# Patient Record
Sex: Female | Born: 1985 | Hispanic: Yes | Marital: Single | Smoking: Never smoker
Health system: Southern US, Community
[De-identification: ages and names within clinical notes are randomized; demographics above are authoritative.]

## PROBLEM LIST (undated history)

## (undated) ENCOUNTER — Inpatient Hospital Stay: Payer: Self-pay

## (undated) DIAGNOSIS — F32A Depression, unspecified: Secondary | ICD-10-CM

## (undated) DIAGNOSIS — Z789 Other specified health status: Secondary | ICD-10-CM

---

## 2008-10-04 ENCOUNTER — Emergency Department: Payer: Self-pay | Admitting: Emergency Medicine

## 2009-03-05 ENCOUNTER — Observation Stay: Payer: Self-pay | Admitting: Obstetrics and Gynecology

## 2009-04-16 ENCOUNTER — Inpatient Hospital Stay: Payer: Self-pay | Admitting: Obstetrics and Gynecology

## 2011-05-30 ENCOUNTER — Ambulatory Visit: Payer: Self-pay | Admitting: Advanced Practice Midwife

## 2011-08-02 ENCOUNTER — Emergency Department: Payer: Self-pay | Admitting: Internal Medicine

## 2011-10-23 ENCOUNTER — Inpatient Hospital Stay: Payer: Self-pay | Admitting: Obstetrics and Gynecology

## 2012-08-01 ENCOUNTER — Emergency Department: Payer: Self-pay | Admitting: Emergency Medicine

## 2012-08-01 LAB — URINALYSIS, COMPLETE
Bacteria: NONE SEEN
Bilirubin,UR: NEGATIVE
Glucose,UR: 50 mg/dL (ref 0–75)
Ketone: NEGATIVE
Leukocyte Esterase: NEGATIVE
Protein: 100
RBC,UR: 31829 /HPF (ref 0–5)
Specific Gravity: 1.017 (ref 1.003–1.030)
WBC UR: 5 /HPF (ref 0–5)

## 2012-08-01 LAB — BASIC METABOLIC PANEL
Anion Gap: 7 (ref 7–16)
Chloride: 104 mmol/L (ref 98–107)
Co2: 26 mmol/L (ref 21–32)
Creatinine: 0.64 mg/dL (ref 0.60–1.30)
EGFR (Non-African Amer.): >60
Glucose: 73 mg/dL (ref 65–99)
Potassium: 3.9 mmol/L (ref 3.5–5.1)
Sodium: 137 mmol/L (ref 136–145)

## 2012-08-01 LAB — HCG, QUANTITATIVE, PREGNANCY: Beta Hcg, Quant.: 3323 m[IU]/mL — ABNORMAL HIGH

## 2012-08-01 LAB — CBC
HGB: 13.5 g/dL (ref 12.0–16.0)
Platelet: 370 10*3/uL (ref 150–440)
RBC: 4.73 10*6/uL (ref 3.80–5.20)
RDW: 14.8 % — ABNORMAL HIGH (ref 11.5–14.5)
WBC: 10.8 10*3/uL (ref 3.6–11.0)

## 2012-11-16 ENCOUNTER — Ambulatory Visit: Payer: Self-pay | Admitting: Family Medicine

## 2013-06-08 ENCOUNTER — Observation Stay: Payer: Self-pay

## 2013-07-02 ENCOUNTER — Inpatient Hospital Stay: Payer: Self-pay

## 2013-07-02 LAB — CBC WITH DIFFERENTIAL/PLATELET
Basophil #: 0.1 10*3/uL (ref 0.0–0.1)
Basophil %: 0.7 %
Eosinophil #: 0.2 10*3/uL (ref 0.0–0.7)
Eosinophil %: 2.7 %
HCT: 33 % — ABNORMAL LOW (ref 35.0–47.0)
Lymphocyte #: 2.7 10*3/uL (ref 1.0–3.6)
Lymphocyte %: 31.7 %
MCH: 24.6 pg — ABNORMAL LOW (ref 26.0–34.0)
MCHC: 33 g/dL (ref 32.0–36.0)
MCV: 75 fL — ABNORMAL LOW (ref 80–100)
Monocyte #: 0.8 x10 3/mm (ref 0.2–0.9)
Neutrophil #: 4.7 10*3/uL (ref 1.4–6.5)
Platelet: 272 10*3/uL (ref 150–440)
RBC: 4.43 10*6/uL (ref 3.80–5.20)
RDW: 17 % — ABNORMAL HIGH (ref 11.5–14.5)

## 2013-07-03 LAB — HEMATOCRIT: HCT: 35.6 % (ref 35.0–47.0)

## 2014-11-24 NOTE — L&D Delivery Note (Signed)
Delivery Note At 10:40 PM a viable viable was delivered via Vaginal, Spontaneous Delivery (Presentation: Right Occiput Anterior).  APGAR: 8, 9; weight 7 lb 3.7 oz (3280 g).   Placenta status: Intact, Spontaneous.  Cord: 3 vessels with the following complications: None.   Anesthesia: None  Episiotomy: None Lacerations: None Suture Repair: n/a Est. Blood Loss (mL): 300  Mom to postpartum.  Baby to Couplet care / Skin to Skin.  Called to see patient.  Mom pushed to delivery viable female infant.  The head followed by shoulders, which delivered without difficulty, and the rest of the body.  Double nuchal cord noted and delivered through, reduced after delivery of baby.  Baby to mom's chest.  Cord clamped and cut after > 1 min delay.  Cord blood obtained.  Placenta delivered spontaneously, intact, with a 3-vessel cord.  Labia, perineum, vagina, and cervix inspected and only mild abrasions noted.  All counts correct.  Hemostasis obtained with IV pitocin and fundal massage. EBL 300 mL.    Conard NovakJackson, Yassine Brunsman D, MD 11/11/2015, 10:59 PM

## 2015-04-03 NOTE — H&P (Signed)
L&D Evaluation:  History:  HPI 29YO g4p2012 WITH edD OF /6/14 WITH PNC at [facility]  significant for Rubella non-immune, unsure dating and Edd BY us ATR 7 6/7 WKS. pT PRESENTS TODAY FOR "C/O "tOOTH PAIN AND LOWER PELVIC DISCOMFORT". pT WENT TO 2 dENTISTS AND WAS TOLD THEY WOULD NOT PULL HER TOOTH WHILE SHE WAS PREG   Presents with Pelvic pain   Patient's Medical History No Chronic Illness   Patient's Surgical History none   Medications Pre Natal Vitamins   Allergies NKDA   Social History none   Family History Non-Contributory   ROS:  ROS All systems were reviewed.  HEENT, CNS, GI, GU, Respiratory, CV, Renal and Musculoskeletal systems were found to be normal.   Exam:  Vital Signs stable   General no apparent distress   Mental Status clear   Chest clear   Heart normal sinus rhythm, no murmur/gallop/rubs   Abdomen gravid, non-tender   Estimated Fetal Weight Average for gestational age   Back no CVAT   Edema 1+   Reflexes 1+   Clonus negative   Pelvic long and not dilated   Mebranes Intact   FHT normal rate with no decels, REacrtive NST   Ucx absent   Skin dry   Lymph no lymphadenopathy   Plan:  Comments Lt Molar on bottom: small piece of tooth has cracked andit is all the way down to the gum line. No abcess noted. Pt is afebrile. Note given to pt to see dentist for tooth to be pulled using xylocaine and PCN and Norco if needed.   Electronic Signatures: Sharee PimpleJones, Jacody Beneke W (CNM)  (Signed 16-Jul-14 23:15)  Authored: L&D Evaluation   Last Updated: 16-Jul-14 23:15 by Sharee PimpleJones, Haya Hemler W (CNM)

## 2015-04-03 NOTE — H&P (Signed)
L&D Evaluation:  History:  HPI 29 y/o G4P2012 @ 40/3wks EDC 07/02/13 arrived with c/o strong contractions, bloody show, denies leaking fluid, baby moving well. Care at [facility] well pregnancy HX anemia, GBS negative   Presents with contractions   Patient's Medical History No Chronic Illness   Patient's Surgical History none   Medications Pre Natal Vitamins   Allergies NKDA   Social History none   Family History Non-Contributory   ROS:  ROS All systems were reviewed.  HEENT, CNS, GI, GU, Respiratory, CV, Renal and Musculoskeletal systems were found to be normal.   Exam:  Vital Signs stable   Urine Protein not completed   General no apparent distress   Mental Status clear   Chest clear   Heart normal sinus rhythm   Abdomen gravid, non-tender   Estimated Fetal Weight Average for gestational age   Fetal Position vtx   Fundal Height term   Back no CVAT   Edema no edema   Reflexes 2+   Clonus negative   Pelvic no external lesions, 7-8cm upon admission rapid progress to complete and pushing   Mebranes Intact, AROM clear @ 0650   Description clear   FHT normal rate with no decels, baseline 130's avg variability with accels. variable decels with 2nd stage repositioned and 02   Fetal Heart Rate 136   Ucx regular, Q 2/3 mins 60 sec strong   Skin dry   Lymph no lymphadenopathy   Impression:  Impression active labor   Plan:  Plan EFM/NST, monitor contractions and for cervical change   Comments Due to computer glitch and busy LD unit - charting is delayed CNM present since 0600. See RN notes for timely CNM exams. Knows what to expect 3rd baby, IV pain meds helpful. Family supportive at bedside.   Electronic Signatures: Albertina ParrLugiano, Sundiata Ferrick B (CNM)  (Signed 09-Aug-14 10:06)  Authored: L&D Evaluation   Last Updated: 09-Aug-14 10:06 by Albertina ParrLugiano, Janeene Sand B (CNM)

## 2015-04-26 ENCOUNTER — Other Ambulatory Visit: Payer: Self-pay | Admitting: Advanced Practice Midwife

## 2015-04-26 DIAGNOSIS — Z369 Encounter for antenatal screening, unspecified: Secondary | ICD-10-CM

## 2015-05-10 ENCOUNTER — Ambulatory Visit: Admission: RE | Admit: 2015-05-10 | Payer: Self-pay | Source: Ambulatory Visit

## 2015-05-10 ENCOUNTER — Ambulatory Visit: Payer: Self-pay

## 2015-06-28 ENCOUNTER — Other Ambulatory Visit: Payer: Self-pay | Admitting: Physician Assistant

## 2015-06-28 DIAGNOSIS — O0992 Supervision of high risk pregnancy, unspecified, second trimester: Secondary | ICD-10-CM

## 2015-07-02 ENCOUNTER — Other Ambulatory Visit: Payer: Self-pay | Admitting: Physician Assistant

## 2015-07-02 DIAGNOSIS — IMO0002 Reserved for concepts with insufficient information to code with codable children: Secondary | ICD-10-CM

## 2015-07-02 DIAGNOSIS — O0932 Supervision of pregnancy with insufficient antenatal care, second trimester: Secondary | ICD-10-CM

## 2015-07-02 DIAGNOSIS — Z0489 Encounter for examination and observation for other specified reasons: Secondary | ICD-10-CM

## 2015-07-03 ENCOUNTER — Ambulatory Visit
Admission: RE | Admit: 2015-07-03 | Discharge: 2015-07-03 | Disposition: A | Discharge status: Home / Self Care | Payer: Self-pay | Source: Ambulatory Visit | Attending: Physician Assistant | Admitting: Physician Assistant

## 2015-07-03 ENCOUNTER — Other Ambulatory Visit: Payer: Self-pay | Admitting: Physician Assistant

## 2015-07-03 DIAGNOSIS — O0932 Supervision of pregnancy with insufficient antenatal care, second trimester: Secondary | ICD-10-CM

## 2015-07-03 DIAGNOSIS — Z36 Encounter for antenatal screening of mother: Secondary | ICD-10-CM | POA: Insufficient documentation

## 2015-07-03 DIAGNOSIS — Z3689 Encounter for other specified antenatal screening: Secondary | ICD-10-CM

## 2015-07-03 DIAGNOSIS — Z0489 Encounter for examination and observation for other specified reasons: Secondary | ICD-10-CM

## 2015-07-03 DIAGNOSIS — IMO0002 Reserved for concepts with insufficient information to code with codable children: Secondary | ICD-10-CM

## 2015-07-27 ENCOUNTER — Other Ambulatory Visit: Payer: Self-pay

## 2015-07-27 ENCOUNTER — Other Ambulatory Visit: Payer: Self-pay | Admitting: Advanced Practice Midwife

## 2015-07-27 DIAGNOSIS — G930 Cerebral cysts: Secondary | ICD-10-CM

## 2015-07-31 ENCOUNTER — Observation Stay
Admission: RE | Admit: 2015-07-31 | Discharge: 2015-07-31 | Disposition: A | Discharge status: Home / Self Care | Payer: Self-pay | Attending: Obstetrics and Gynecology | Admitting: Obstetrics and Gynecology

## 2015-07-31 DIAGNOSIS — Z3A24 24 weeks gestation of pregnancy: Secondary | ICD-10-CM | POA: Insufficient documentation

## 2015-07-31 NOTE — OB Triage Note (Signed)
Maria Gates sent over from [facility] for monitoring. She denies bleeding, LOF, cramping. States midwife at [facility] had some concern regarding infant's heart rate.

## 2015-07-31 NOTE — H&P (Signed)
Obstetric H&P   Chief Complaint: Abnormal fetal heart-tones  Prenatal Care Provider: [facility]  History of Present Illness: 29 y.o. G4P3 [redacted]w[redacted]d by 11/16/2015, presenting for routine prenatal visit at [facility] with irregular heartbeat in the 110's.  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  The patient dose have MFM follow up to re-image choroid plexus cyst.    Patient reports +FM, no LOF, no VB.  Pregnancy thus far notable for inconsistent prenatal care.    Review of Systems: 10 point review of systems negative unless otherwise noted in HPI  Past Medical History: History reviewed. No pertinent past medical history.  Past Surgical History: History reviewed. No pertinent past surgical history.  Family History: History reviewed. No pertinent family history.  Social History: Social History   Social History  . Marital Status: Single    Spouse Name: N/A  . Number of Children: N/A  . Years of Education: N/A   Occupational History  . Not on file.   Social History Main Topics  . Smoking status: Never Smoker   . Smokeless tobacco: Never Used  . Alcohol Use: No  . Drug Use: No  . Sexual Activity: Yes   Other Topics Concern  . Not on file   Social History Narrative  . No narrative on file    Medications: Prior to Admission medications   Medication Sig Start Date End Date Taking? Authorizing Provider  Prenatal Vit-Fe Fumarate-FA (PRENATAL MULTIVITAMIN) TABS tablet Take 1 tablet by mouth daily at 12 noon.   Yes Historical Provider, MD    Allergies: No Known Allergies  Physical Exam: Vitals: Blood pressure 103/65, pulse 76, temperature 98.5 F (36.9 C), temperature source Oral, resp. rate 16, last menstrual period 02/09/2015.  FHT: 150, moderate, no accels, no decels Toco: absent  General: NAD HEENT: normocephalic, anicteric Pulmonary: no increased work of  breathing Abdomen: Gravid, non-tender Genitourinary: deferred Extremities: no edema  Labs: No results found for this or any previous visit (from the past 24 hour(s)).  Assessment: 29 y.o. G4P3 [redacted]w[redacted]d by 11/16/2015, choroid plexus cyst, abnormal fetal heartones  Plan: 1) Fetus - appropriate tracing for 24 weeks.  Discussed T18 usually multiple findings on ultrasound not just isolated choroid plexus cyst.  Normal FHT ausculated here on L&D  2) Disposition - home, has [facility] and MFM follow up

## 2015-08-09 ENCOUNTER — Ambulatory Visit
Admission: RE | Admit: 2015-08-09 | Discharge: 2015-08-09 | Disposition: A | Discharge status: Home / Self Care | Payer: Self-pay | Source: Ambulatory Visit | Attending: Obstetrics & Gynecology | Admitting: Obstetrics & Gynecology

## 2015-08-09 ENCOUNTER — Other Ambulatory Visit: Payer: Self-pay | Admitting: Obstetrics & Gynecology

## 2015-08-09 VITALS — BP 116/75 | HR 79 | Temp 98.2°F | Resp 18 | Ht 60.0 in | Wt 148.6 lb

## 2015-08-09 DIAGNOSIS — IMO0002 Reserved for concepts with insufficient information to code with codable children: Secondary | ICD-10-CM | POA: Insufficient documentation

## 2015-08-09 DIAGNOSIS — O283 Abnormal ultrasonic finding on antenatal screening of mother: Secondary | ICD-10-CM | POA: Insufficient documentation

## 2015-08-09 DIAGNOSIS — O350XX0 Maternal care for (suspected) central nervous system malformation in fetus, not applicable or unspecified: Secondary | ICD-10-CM

## 2015-08-09 DIAGNOSIS — Z3A26 26 weeks gestation of pregnancy: Secondary | ICD-10-CM | POA: Insufficient documentation

## 2015-08-09 DIAGNOSIS — G930 Cerebral cysts: Secondary | ICD-10-CM

## 2015-08-09 HISTORY — DX: Other specified health status: Z78.9

## 2015-08-27 ENCOUNTER — Inpatient Hospital Stay: Admission: RE | Admit: 2015-08-27 | Payer: Self-pay | Source: Ambulatory Visit

## 2015-10-04 ENCOUNTER — Ambulatory Visit
Admission: RE | Admit: 2015-10-04 | Discharge: 2015-10-04 | Disposition: A | Discharge status: Home / Self Care | Payer: Self-pay | Source: Ambulatory Visit | Attending: Obstetrics and Gynecology | Admitting: Obstetrics and Gynecology

## 2015-10-04 DIAGNOSIS — O350XX0 Maternal care for (suspected) central nervous system malformation in fetus, not applicable or unspecified: Secondary | ICD-10-CM

## 2015-10-04 DIAGNOSIS — O283 Abnormal ultrasonic finding on antenatal screening of mother: Secondary | ICD-10-CM | POA: Insufficient documentation

## 2015-10-04 DIAGNOSIS — Z3A33 33 weeks gestation of pregnancy: Secondary | ICD-10-CM | POA: Insufficient documentation

## 2015-10-04 DIAGNOSIS — IMO0002 Reserved for concepts with insufficient information to code with codable children: Secondary | ICD-10-CM

## 2015-10-23 ENCOUNTER — Observation Stay
Admission: RE | Admit: 2015-10-23 | Discharge: 2015-10-23 | Disposition: A | Discharge status: Home / Self Care | Payer: Self-pay | Attending: Obstetrics and Gynecology | Admitting: Obstetrics and Gynecology

## 2015-10-23 ENCOUNTER — Encounter: Payer: Self-pay | Admitting: *Deleted

## 2015-10-23 DIAGNOSIS — R109 Unspecified abdominal pain: Secondary | ICD-10-CM

## 2015-10-23 DIAGNOSIS — O0930 Supervision of pregnancy with insufficient antenatal care, unspecified trimester: Secondary | ICD-10-CM

## 2015-10-23 DIAGNOSIS — O34219 Maternal care for unspecified type scar from previous cesarean delivery: Secondary | ICD-10-CM | POA: Diagnosis present

## 2015-10-23 DIAGNOSIS — Z3A39 39 weeks gestation of pregnancy: Secondary | ICD-10-CM

## 2015-10-23 DIAGNOSIS — O0993 Supervision of high risk pregnancy, unspecified, third trimester: Secondary | ICD-10-CM

## 2015-10-23 DIAGNOSIS — O2343 Unspecified infection of urinary tract in pregnancy, third trimester: Principal | ICD-10-CM | POA: Insufficient documentation

## 2015-10-23 DIAGNOSIS — Z3A36 36 weeks gestation of pregnancy: Secondary | ICD-10-CM | POA: Insufficient documentation

## 2015-10-23 LAB — URINALYSIS COMPLETE WITH MICROSCOPIC (ARMC ONLY)
Bilirubin Urine: NEGATIVE
Glucose, UA: NEGATIVE mg/dL
Hgb urine dipstick: NEGATIVE
Ketones, ur: NEGATIVE mg/dL
Nitrite: NEGATIVE
PH: 6 (ref 5.0–8.0)
PROTEIN: NEGATIVE mg/dL
Specific Gravity, Urine: 1.003 — ABNORMAL LOW (ref 1.005–1.030)

## 2015-10-23 MED ORDER — NITROFURANTOIN MONOHYD MACRO 100 MG PO CAPS: 100.0000 mg | ORAL_CAPSULE | Freq: Two times a day (BID) | ORAL | Status: AC

## 2015-10-23 NOTE — Progress Notes (Signed)
Dr. Jean RosenthalJackson in room to see pt. Ultrasound at bedside.

## 2015-10-23 NOTE — H&P (Signed)
  Obstetric H&P   Chief Complaint: lower abdominal pain  Prenatal Care Provider: [facility]  History of Present Illness: 29 y.o. [id number] at 5649w4d with 11/16/15, who presents with two days of lower abdominal pain with pelvic pressure.  She has also had intermittent nausea.  Patient reports +FM, no LOF, no VB.  Pregnancy thus far notable for inconsistent prenatal care.    Review of Systems: 10 point review of systems negative unless otherwise noted in HPI  Past Medical History  Diagnosis Date  . Medical history non-contributory     Past Surgical History:  History reviewed. No pertinent past surgical history.  Family History: History reviewed. No pertinent family history.  Social History   Social History  . Marital Status: Single    Spouse Name: N/A  . Number of Children: N/A  . Years of Education: N/A   Occupational History  . Not on file.   Social History Main Topics  . Smoking status: Never Smoker   . Smokeless tobacco: Never Used  . Alcohol Use: No  . Drug Use: No  . Sexual Activity: Yes   Other Topics Concern  . Not on file   Social History Narrative   Medications: Prior to Admission medications   Medication Sig Start Date End Date Taking? Authorizing Provider  Prenatal Vit-Fe Fumarate-FA (PRENATAL MULTIVITAMIN) TABS tablet Take 1 tablet by mouth daily at 12 noon.   Yes Historical Provider, MD   Allergies: No Known Allergies  Physical Exam: BP 120/82 mmHg  Pulse 103  Temp(Src) 98.4 F (36.9 C) (Oral)  Resp 18  LMP 02/09/2015  FHT: 140, moderate, +accels,  Episodic variable decels that are shallow and minimal duration (15-20 seconds). Toco: absent  General: NAD HEENT: normocephalic, anicteric Pulmonary: no increased work of breathing Abdomen: Gravid, non-tender Genitourinary: deferred Extremities: no edema  Lab Results  Component Value Date   APPEARANCEUR HAZY* 10/23/2015   GLUCOSEU NEGATIVE 10/23/2015   BILIRUBINUR NEGATIVE 10/23/2015   KETONESUR  NEGATIVE 10/23/2015   LABSPEC 1.003* 10/23/2015   HGBUR NEGATIVE 10/23/2015   PHURINE 6.0 10/23/2015   PROTEINUR NEGATIVE 10/23/2015   NITRITE NEGATIVE 10/23/2015   LEUKOCYTESUR 3+* 10/23/2015   RBCU 6-30 10/23/2015   WBCU 0-5 10/23/2015   BACTERIA RARE* 10/23/2015   EPIU 0-5* 10/23/2015   Bedside ultrasound: Number of fetuses: 1 Position: cephalic AFI: 12cm  Assessment: 29 y.o. G4P3 at 1949w4d with EDD 11/16/2015, with likely UTI.    Plan: 1) Fetus - Reassuring overall with occasional episodic variable decelerations and normal AFI. 2) UTI: rx for macrobid 100mg  po bid x 7 days 2) Disposition - home, has no [facility] appointment. Strongly encouraged her to make appointment.  Thomasene MohairStephen Amor Hyle, MD 10/23/2015 8:53 PM

## 2015-10-25 LAB — CULTURE, BETA STREP (GROUP B ONLY)

## 2015-11-11 ENCOUNTER — Inpatient Hospital Stay
Admission: EM | Admit: 2015-11-11 | Discharge: 2015-11-13 | DRG: 774 | Disposition: A | Discharge status: Home / Self Care | Payer: Self-pay | Attending: Obstetrics and Gynecology | Admitting: Obstetrics and Gynecology

## 2015-11-11 DIAGNOSIS — Z3490 Encounter for supervision of normal pregnancy, unspecified, unspecified trimester: Secondary | ICD-10-CM

## 2015-11-11 DIAGNOSIS — Z3A39 39 weeks gestation of pregnancy: Secondary | ICD-10-CM

## 2015-11-11 DIAGNOSIS — O0933 Supervision of pregnancy with insufficient antenatal care, third trimester: Secondary | ICD-10-CM

## 2015-11-11 DIAGNOSIS — O98313 Other infections with a predominantly sexual mode of transmission complicating pregnancy, third trimester: Secondary | ICD-10-CM | POA: Diagnosis present

## 2015-11-11 DIAGNOSIS — O9902 Anemia complicating childbirth: Principal | ICD-10-CM | POA: Diagnosis present

## 2015-11-11 DIAGNOSIS — A562 Chlamydial infection of genitourinary tract, unspecified: Secondary | ICD-10-CM | POA: Diagnosis present

## 2015-11-11 LAB — ABO/RH: ABO/RH(D): O POS

## 2015-11-11 LAB — TYPE AND SCREEN
ABO/RH(D): O POS
ANTIBODY SCREEN: NEGATIVE

## 2015-11-11 LAB — CBC
HCT: 31.9 % — ABNORMAL LOW (ref 35.0–47.0)
HEMOGLOBIN: 10.2 g/dL — AB (ref 12.0–16.0)
MCH: 24.4 pg — AB (ref 26.0–34.0)
MCHC: 32.1 g/dL (ref 32.0–36.0)
MCV: 76 fL — ABNORMAL LOW (ref 80.0–100.0)
PLATELETS: 277 10*3/uL (ref 150–440)
RBC: 4.2 MIL/uL (ref 3.80–5.20)
RDW: 15.5 % — ABNORMAL HIGH (ref 11.5–14.5)
WBC: 9.6 10*3/uL (ref 3.6–11.0)

## 2015-11-11 MED ORDER — LACTATED RINGERS IV SOLN
INTRAVENOUS | Status: DC
  Administered 2015-11-11: 19:00:00 via INTRAVENOUS

## 2015-11-11 MED ORDER — CITRIC ACID-SODIUM CITRATE 334-500 MG/5ML PO SOLN: 30.0000 mL | ORAL | Status: DC | PRN

## 2015-11-11 MED ORDER — ONDANSETRON HCL 4 MG/2ML IJ SOLN: 4.0000 mg | Freq: Four times a day (QID) | INTRAMUSCULAR | Status: DC | PRN

## 2015-11-11 MED ORDER — SODIUM CHLORIDE 0.9 % IV SOLN
INTRAVENOUS | Status: AC
  Administered 2015-11-11: 1 g via INTRAVENOUS
  Filled 2015-11-11: qty 1000

## 2015-11-11 MED ORDER — SODIUM CHLORIDE 0.9 % IV SOLN
2.0000 g | Freq: Once | INTRAVENOUS | Status: AC
  Administered 2015-11-11: 2 g via INTRAVENOUS

## 2015-11-11 MED ORDER — AMMONIA AROMATIC IN INHA
RESPIRATORY_TRACT | Status: AC
  Filled 2015-11-11: qty 10

## 2015-11-11 MED ORDER — OXYTOCIN 10 UNIT/ML IJ SOLN: 10.0000 [IU] | Freq: Once | INTRAMUSCULAR | Status: DC

## 2015-11-11 MED ORDER — LIDOCAINE HCL (PF) 1 % IJ SOLN
30.0000 mL | INTRAMUSCULAR | Status: DC | PRN
  Filled 2015-11-11: qty 30

## 2015-11-11 MED ORDER — OXYTOCIN BOLUS FROM INFUSION: 500.0000 mL | INTRAVENOUS | Status: DC

## 2015-11-11 MED ORDER — LIDOCAINE HCL (PF) 1 % IJ SOLN
INTRAMUSCULAR | Status: AC
  Filled 2015-11-11: qty 30

## 2015-11-11 MED ORDER — SODIUM CHLORIDE 0.9 % IV SOLN
1.0000 g | INTRAVENOUS | Status: DC
  Administered 2015-11-11: 1 g via INTRAVENOUS
  Filled 2015-11-11 (×3): qty 1000

## 2015-11-11 MED ORDER — MISOPROSTOL 200 MCG PO TABS
ORAL_TABLET | ORAL | Status: AC
  Filled 2015-11-11: qty 4

## 2015-11-11 MED ORDER — LACTATED RINGERS IV SOLN: 500.0000 mL | INTRAVENOUS | Status: DC | PRN

## 2015-11-11 MED ORDER — OXYTOCIN 40 UNITS IN LACTATED RINGERS INFUSION - SIMPLE MED
62.5000 mL/h | INTRAVENOUS | Status: DC
  Administered 2015-11-11: 40 [IU] via INTRAVENOUS

## 2015-11-11 MED ORDER — OXYTOCIN 40 UNITS IN LACTATED RINGERS INFUSION - SIMPLE MED
INTRAVENOUS | Status: AC
  Administered 2015-11-11: 40 [IU] via INTRAVENOUS
  Filled 2015-11-11: qty 1000

## 2015-11-11 MED ORDER — SODIUM CHLORIDE 0.9 % IV SOLN
INTRAVENOUS | Status: AC
  Administered 2015-11-11: 2 g via INTRAVENOUS
  Filled 2015-11-11: qty 2000

## 2015-11-12 LAB — CHLAMYDIA/NGC RT PCR (ARMC ONLY)
CHLAMYDIA TR: DETECTED — AB
N gonorrhoeae: NOT DETECTED

## 2015-11-12 LAB — CBC
HCT: 30.5 % — ABNORMAL LOW (ref 35.0–47.0)
HEMOGLOBIN: 9.4 g/dL — AB (ref 12.0–16.0)
MCH: 23.5 pg — AB (ref 26.0–34.0)
MCHC: 30.8 g/dL — AB (ref 32.0–36.0)
MCV: 76.3 fL — ABNORMAL LOW (ref 80.0–100.0)
Platelets: 222 10*3/uL (ref 150–440)
RBC: 4 MIL/uL (ref 3.80–5.20)
RDW: 15.6 % — ABNORMAL HIGH (ref 11.5–14.5)
WBC: 16.3 10*3/uL — ABNORMAL HIGH (ref 3.6–11.0)

## 2015-11-12 MED ORDER — SIMETHICONE 80 MG PO CHEW: 80.0000 mg | CHEWABLE_TABLET | ORAL | Status: DC | PRN

## 2015-11-12 MED ORDER — ONDANSETRON HCL 4 MG PO TABS: 4.0000 mg | ORAL_TABLET | ORAL | Status: DC | PRN

## 2015-11-12 MED ORDER — WITCH HAZEL-GLYCERIN EX PADS: 1.0000 "application " | MEDICATED_PAD | CUTANEOUS | Status: DC | PRN

## 2015-11-12 MED ORDER — OXYTOCIN 40 UNITS IN LACTATED RINGERS INFUSION - SIMPLE MED: 62.5000 mL/h | INTRAVENOUS | Status: DC | PRN

## 2015-11-12 MED ORDER — PRENATAL MULTIVITAMIN CH
1.0000 | ORAL_TABLET | Freq: Every day | ORAL | Status: DC
  Administered 2015-11-12 – 2015-11-13 (×2): 1 via ORAL
  Filled 2015-11-12 (×2): qty 1

## 2015-11-12 MED ORDER — SENNOSIDES-DOCUSATE SODIUM 8.6-50 MG PO TABS: 2.0000 | ORAL_TABLET | ORAL | Status: DC

## 2015-11-12 MED ORDER — ACETAMINOPHEN 325 MG PO TABS: 650.0000 mg | ORAL_TABLET | ORAL | Status: DC | PRN

## 2015-11-12 MED ORDER — AZITHROMYCIN 250 MG PO TABS
1000.0000 mg | ORAL_TABLET | Freq: Once | ORAL | Status: AC
  Administered 2015-11-12: 1000 mg via ORAL
  Filled 2015-11-12: qty 4

## 2015-11-12 MED ORDER — FERROUS SULFATE 325 (65 FE) MG PO TABS
325.0000 mg | ORAL_TABLET | Freq: Two times a day (BID) | ORAL | Status: DC
  Administered 2015-11-12 – 2015-11-13 (×3): 325 mg via ORAL
  Filled 2015-11-12 (×3): qty 1

## 2015-11-12 MED ORDER — DIPHENHYDRAMINE HCL 25 MG PO CAPS: 25.0000 mg | ORAL_CAPSULE | Freq: Four times a day (QID) | ORAL | Status: DC | PRN

## 2015-11-12 MED ORDER — LANOLIN HYDROUS EX OINT: TOPICAL_OINTMENT | CUTANEOUS | Status: DC | PRN

## 2015-11-12 MED ORDER — BENZOCAINE-MENTHOL 20-0.5 % EX AERO: 1.0000 "application " | INHALATION_SPRAY | CUTANEOUS | Status: DC | PRN

## 2015-11-12 MED ORDER — DIBUCAINE 1 % RE OINT: 1.0000 "application " | TOPICAL_OINTMENT | RECTAL | Status: DC | PRN

## 2015-11-12 MED ORDER — ONDANSETRON HCL 4 MG/2ML IJ SOLN: 4.0000 mg | INTRAMUSCULAR | Status: DC | PRN

## 2015-11-12 MED ORDER — HYDROCODONE-ACETAMINOPHEN 5-325 MG PO TABS
2.0000 | ORAL_TABLET | Freq: Four times a day (QID) | ORAL | Status: DC | PRN
  Filled 2015-11-12: qty 2

## 2015-11-12 MED ORDER — HYDROCODONE-ACETAMINOPHEN 5-325 MG PO TABS
1.0000 | ORAL_TABLET | Freq: Four times a day (QID) | ORAL | Status: DC | PRN
  Administered 2015-11-12 – 2015-11-13 (×3): 1 via ORAL
  Filled 2015-11-12: qty 1

## 2015-11-12 MED ORDER — IBUPROFEN 600 MG PO TABS
600.0000 mg | ORAL_TABLET | Freq: Four times a day (QID) | ORAL | Status: DC
  Administered 2015-11-12 – 2015-11-13 (×5): 600 mg via ORAL
  Filled 2015-11-12 (×5): qty 1

## 2015-11-12 NOTE — Discharge Summary (Signed)
OB Discharge Summary  Patient Name: Maria Gates DOB: Sep 10, 1986 MRN: [id number]  Prenatal care site:  [facility]  Date of admission: 11/11/2015 Delivering MD: Thomasene Mohair, MD Date of Delivery: 11/12/2015  Date of discharge: 11/13/2015  Admitting diagnosis:  1) supervision normal pregnancy, third trimester 2) inadequate prenatal care 3) [redacted] weeks gestation  Intrauterine pregnancy: [redacted]w[redacted]d  Secondary diagnosis: None     Discharge diagnosis: Term Pregnancy Delivered                                                                                                Post partum procedures:none  Augmentation: None  Complications: None  Hospital course:  Onset of Labor With Vaginal Delivery     29 y.o. yo G5P0013 at [redacted]w[redacted]d was admitted in Active Laboron 11/11/2015. Patient had an uncomplicated labor course as follows:  Membrane Rupture Time/Date: 10:34 PM ,11/11/2015   Intrapartum Procedures: Episiotomy: None [1]                                         Lacerations:  None [1]  Patient had a delivery of a Viable infant. 11/11/2015  Information for the patient's newborn:  Tenicia, Gural [[id number]]  Delivery Method: Vag-Spont     Pateint had an uncomplicated postpartum course.  She is ambulating, tolerating a regular diet, passing flatus, and urinating well. Patient is discharged home in stable condition on PPD 2.  She was noted to have chlamydia on admission, which was treated with azithromycin.     Physical exam  Filed Vitals:   11/12/15 1900 11/13/15 0111 11/13/15 0448 11/13/15 0825  BP: 110/66   104/60  Pulse: 87   76  Temp: 98.3 F (36.8 C) 98.2 F (36.8 C) 98 F (36.7 C) 98.3 F (36.8 C)  TempSrc:   Oral Oral  Resp: 18   18  Height:      Weight:      SpO2: 100%   99%   General: alert Lochia: appropriate Uterine Fundus: firm Incision: N/A DVT Evaluation: No evidence of DVT seen on physical exam.  Labs: Lab Results  Component Value Date   WBC  16.3* 11/12/2015   HGB 9.4* 11/12/2015   HCT 30.5* 11/12/2015   MCV 76.3* 11/12/2015   PLT 222 11/12/2015   CMP Latest Ref Rng 08/01/2012  Glucose 65-99 mg/dL 73  BUN 7-82 mg/dL 9  Creatinine 9.56-2.13 mg/dL 0.86  Sodium 578-469 mmol/L 137  Potassium 3.5-5.1 mmol/L 3.9  Chloride 98-107 mmol/L 104  CO2 21-32 mmol/L 26  Calcium 8.5-10.1 mg/dL 9.1   Lab Results  Component Value Date   CHLAMYDIA DETECTED* 11/11/2015   Discharge instruction: per After Visit Summary.  Medications:    Medication List    ASK your doctor about these medications        ferrous sulfate 325 (65 FE) MG tablet  Take 325 mg by mouth daily with breakfast.     prenatal multivitamin Tabs tablet  Take 1 tablet by mouth daily at 12 noon.  Diet: routine diet  Activity: Advance as tolerated. Pelvic rest for 6 weeks.   Outpatient follow up: Follow-up Information    [facility] weeks.   Why:  for routine post partum care   Contact information:   [address] [place] [phone]       Postpartum contraception: Nexplanon Rhogam Given postpartum: no Rubella vaccine given postpartum: no Varicella vaccine given postpartum: no TDaP given antepartum or postpartum: postpartum Flu vaccine given antepartum or postpartum: postpartum  Newborn Data: Live born female  Birth Weight: 7 lb 3.7 oz (3280 g) APGAR: 8, 9  Baby Feeding: Breast  Disposition:home with mother  Chellsie Gomer, Elenora Fenderhelsea C, MD 11/13/2015 10:13 AM

## 2015-11-12 NOTE — Progress Notes (Signed)
Admit Date: 11/11/2015 Today's Date: 11/12/2015  Post Partum Day 1  Subjective:  no complaints  Objective: Temp:  [98.7 F (37.1 C)-98.9 F (37.2 C)] 98.7 F (37.1 C) (12/19 0737) Pulse Rate:  [69-84] 69 (12/19 0737) Resp:  [18-20] 18 (12/19 0737) BP: (112-123)/(62-72) 116/65 mmHg (12/19 0737) SpO2:  [99 %] 99 % (12/19 0314) Weight:  [155 lb (70.308 kg)] 155 lb (70.308 kg) (12/18 1938)  Physical Exam:  General: alert, cooperative and no distress Lochia: appropriate Uterine Fundus: firm Incision: none DVT Evaluation: No evidence of DVT seen on physical exam.   Recent Labs  11/11/15 1923 11/12/15 0610  HGB 10.2* 9.4*  HCT 31.9* 30.5*    Assessment/Plan: Breastfeeding, Contraception --plans Nexplanon and Infant doing well  Azithromycin given for pos chlamydia test on admission; explained to pt. Anemia, iron therapy Reg diet, ambulate O+, RI, VI   LOS: 1 day   [facility] 11/12/2015, 10:24 AM

## 2015-11-12 NOTE — H&P (Signed)
OB History & Physical   History of Present Illness:  Chief Complaint: contractions  HPI:  Maria Gates is a 29 y.o. N5A2130 female at 21w3dby 20 week ultrasound.  Her pregnancy has been complicated by inconsistent prenatal care and UTI early in pregnancy.    She reports contractions.   She denies leakage of fluid.   She denies vaginal bleeding.   She reports fetal movement.    Maternal Medical History:   Past Medical History  Diagnosis Date  . Medical history non-contributory    Past Surgical History: History reviewed. No pertinent past surgical history.  Allergies: No Known Allergies  Prior to Admission medications   Medication Sig Start Date End Date Taking? Authorizing Provider  ferrous sulfate 325 (65 FE) MG tablet Take 325 mg by mouth daily with breakfast.    Historical Provider, MD  Prenatal Vit-Fe Fumarate-FA (PRENATAL MULTIVITAMIN) TABS tablet Take 1 tablet by mouth daily at 12 noon.    Historical Provider, MD    OB History  Gravida Para Term Preterm AB SAB TAB Ectopic Multiple Living  5 3   1 1    3     # Outcome Date GA Lbr Len/2nd Weight Sex Delivery Anes PTL Lv  5 Gravida           4 Para 07/02/13    F    Y  3 SAB 08/06/12 725w6d F      2 Para 10/24/11    F Vag-Spont   Y  1 Para 04/16/09    F Vag-Spont   Y      Prenatal care site: [facility]  Social History: She  reports that she has never smoked. She has never used smokeless tobacco. She reports that she does not drink alcohol or use illicit drugs.  Family History: family history is not on file.   Review of Systems: Negative x 10 systems reviewed except as noted in the HPI.    Physical Exam:  Vital Signs: BP 123/72 mmHg  Pulse 84  Temp(Src) 98.9 F (37.2 C) (Oral)  Resp 20  Ht 5' (1.524 m)  Wt 155 lb (70.308 kg)  BMI 30.27 kg/m2  SpO2 99%  Breastfeeding? Unknown General: no acute distress.  HEENT: normocephalic, atraumatic Heart: regular rate & rhythm.  No murmurs/rubs/gallops Lungs: clear to  auscultation bilaterally Abdomen: soft, gravid, non-tender;  EFW: 7 pounds Pelvic:   External: Normal external female genitalia  Cervix: Dilation: 8.5 / Effacement (%): 90 / Station: -1  Extremities: non-tender, symmetric, no edema bilaterally.    Neurologic: Alert & oriented x 3.    Pertinent Results:  Prenatal Labs: Blood type/Rh O positive  Antibody screen negative  Rubella Negative by MMR documentation  Varicella Immune    RPR NR  HBsAg negative  HIV negative  GC negative  Chlamydia negative  Genetic screening n/a  1 hour GTT No record  3 hour GTT No record  GBS unknown   Overall assessment: category 1  Assessment:  YeTanecia Mccays a [age].o. [id number] at 3972w3dth active labor.   Plan:  1. Admit to Labor & Delivery  2. CBC, T&S, Clrs, IVF 3. GBS unknown: ampicillin 2 grams stat.   4. Fetwal well-being: reassuring overall 5. Anticipate vaginal delivery soon.   JacWill BonnetD 11/12/2015 2:30 AM

## 2015-11-13 LAB — RPR: RPR: NONREACTIVE

## 2015-11-13 MED ORDER — TETANUS-DIPHTH-ACELL PERTUSSIS 5-2.5-18.5 LF-MCG/0.5 IM SUSP
0.5000 mL | INTRAMUSCULAR | Status: AC | PRN
  Administered 2015-11-13: 0.5 mL via INTRAMUSCULAR
  Filled 2015-11-13: qty 0.5

## 2015-11-13 MED ORDER — INFLUENZA VAC SPLIT QUAD 0.5 ML IM SUSY
0.5000 mL | PREFILLED_SYRINGE | INTRAMUSCULAR | Status: AC | PRN
  Administered 2015-11-13: 0.5 mL via INTRAMUSCULAR
  Filled 2015-11-13: qty 0.5

## 2015-11-13 MED ORDER — ACETAMINOPHEN 325 MG PO TABS: 650.0000 mg | ORAL_TABLET | ORAL | Status: DC | PRN

## 2015-11-13 MED ORDER — IBUPROFEN 600 MG PO TABS: 600.0000 mg | ORAL_TABLET | Freq: Four times a day (QID) | ORAL | Status: DC

## 2015-11-13 NOTE — Discharge Instructions (Signed)
Discharge instructions:   Call office if you have any of the following: headache, visual changes, fever >100 F, chills, breast concerns, excessive vaginal bleeding, incision drainage or problems, leg pain or redness, depression or any other concerns.   Activity: Do not lift > 10 lbs for 6 weeks.  No intercourse or tampons for 6 weeks.  No driving for 1-2 weeks.   Call your doctor for increased pain or vaginal bleeding, temperature above 100.4, depression, or concerns.  No strenuous activity or heavy lifting for 6 weeks.  No intercourse, tampons, douching, or enemas for 6 weeks.  No tub baths-showers only.  No driving for 2 weeks or while taking pain medications.  Continue prenatal vitamin and iron.  Increase calories and fluids while breastfeeding. Postpartum Care After Vaginal Delivery After you deliver your newborn (postpartum period), the usual stay in the hospital is 24-72 hours. If there were problems with your labor or delivery, or if you have other medical problems, you might be in the hospital longer.  While you are in the hospital, you will receive help and instructions on how to care for yourself and your newborn during the postpartum period.  While you are in the hospital:  Be sure to tell your nurses if you have pain or discomfort, as well as where you feel the pain and what makes the pain worse.  If you had an incision made near your vagina (episiotomy) or if you had some tearing during delivery, the nurses may put ice packs on your episiotomy or tear. The ice packs may help to reduce the pain and swelling.  If you are breastfeeding, you may feel uncomfortable contractions of your uterus for a couple of weeks. This is normal. The contractions help your uterus get back to normal size.  It is normal to have some bleeding after delivery.  For the first 1-3 days after delivery, the flow is red and the amount may be similar to a period.  It is common for the flow to start and  stop.  In the first few days, you may pass some small clots. Let your nurses know if you begin to pass large clots or your flow increases.  Do not  flush blood clots down the toilet before having the nurse look at them.  During the next 3-10 days after delivery, your flow should become more watery and pink or brown-tinged in color.  Ten to fourteen days after delivery, your flow should be a small amount of yellowish-white discharge.  The amount of your flow will decrease over the first few weeks after delivery. Your flow may stop in 6-8 weeks. Most women have had their flow stop by 12 weeks after delivery.  You should change your sanitary pads frequently.  Wash your hands thoroughly with soap and water for at least 20 seconds after changing pads, using the toilet, or before holding or feeding your newborn.  You should feel like you need to empty your bladder within the first 6-8 hours after delivery.  In case you become weak, lightheaded, or faint, call your nurse before you get out of bed for the first time and before you take a shower for the first time.  Within the first few days after delivery, your breasts may begin to feel tender and full. This is called engorgement. Breast tenderness usually goes away within 48-72 hours after engorgement occurs. You may also notice milk leaking from your breasts. If you are not breastfeeding, do not stimulate your breasts.  Breast stimulation can make your breasts produce more milk.  Spending as much time as possible with your newborn is very important. During this time, you and your newborn can feel close and get to know each other. Having your newborn stay in your room (rooming in) will help to strengthen the bond with your newborn. It will give you time to get to know your newborn and become comfortable caring for your newborn.  Your hormones change after delivery. Sometimes the hormone changes can temporarily cause you to feel sad or tearful. These  feelings should not last more than a few days. If these feelings last longer than that, you should talk to your caregiver.  If desired, talk to your caregiver about methods of family planning or contraception.  Talk to your caregiver about immunizations. Your caregiver may want you to have the following immunizations before leaving the hospital:  Tetanus, diphtheria, and pertussis (Tdap) or tetanus and diphtheria (Td) immunization. It is very important that you and your family (including grandparents) or others caring for your newborn are up-to-date with the Tdap or Td immunizations. The Tdap or Td immunization can help protect your newborn from getting ill.  Rubella immunization.  Varicella (chickenpox) immunization.  Influenza immunization. You should receive this annual immunization if you did not receive the immunization during your pregnancy.   This information is not intended to replace advice given to you by your health care provider. Make sure you discuss any questions you have with your health care provider.   Document Released: 09/07/2007 Document Revised: 08/04/2012 Document Reviewed: 07/07/2012 Elsevier Interactive Patient Education [employer]

## 2015-11-13 NOTE — Progress Notes (Signed)
Discharge instructions reviewed with patient.  Questions answered.  Tdap and Flu given.  Patient to take shower and will be wheeled down with baby.

## 2016-11-19 ENCOUNTER — Observation Stay
Admission: EM | Admit: 2016-11-19 | Discharge: 2016-11-20 | Disposition: A | Discharge status: Home / Self Care | Payer: Self-pay | Attending: Obstetrics and Gynecology | Admitting: Obstetrics and Gynecology

## 2016-11-19 DIAGNOSIS — O0933 Supervision of pregnancy with insufficient antenatal care, third trimester: Secondary | ICD-10-CM | POA: Insufficient documentation

## 2016-11-19 DIAGNOSIS — R1030 Lower abdominal pain, unspecified: Secondary | ICD-10-CM | POA: Insufficient documentation

## 2016-11-19 DIAGNOSIS — R109 Unspecified abdominal pain: Secondary | ICD-10-CM

## 2016-11-19 DIAGNOSIS — M549 Dorsalgia, unspecified: Secondary | ICD-10-CM | POA: Insufficient documentation

## 2016-11-19 DIAGNOSIS — Z3A28 28 weeks gestation of pregnancy: Secondary | ICD-10-CM | POA: Insufficient documentation

## 2016-11-19 DIAGNOSIS — O26893 Other specified pregnancy related conditions, third trimester: Principal | ICD-10-CM | POA: Insufficient documentation

## 2016-11-19 DIAGNOSIS — O26899 Other specified pregnancy related conditions, unspecified trimester: Secondary | ICD-10-CM | POA: Diagnosis present

## 2016-11-19 MED ORDER — ACETAMINOPHEN 325 MG PO TABS
650.0000 mg | ORAL_TABLET | Freq: Four times a day (QID) | ORAL | Status: DC | PRN
  Administered 2016-11-19: 650 mg via ORAL
  Filled 2016-11-19: qty 2

## 2016-11-19 NOTE — Final Progress Note (Signed)
L&D OB Triage Note  Maria Gates is a 30 y.o. [phone] female at Unknown gestation, unsure LMP, approximately 28 weeks by fundal height with no prenatal care who presented to triage for complaints of lower abdominal pain and back pain.  Denies contractions, LOF, vaginal bleeding.  Does note fetal movement.  She was evaluated by the nurses with no significant findings preterm labor. Vital signs stable. An NST was performed and has been reviewed by MD. She was treated with Tylenol ES for pain .    NST INTERPRETATION: Indications: rule out uterine contractions  Mode: External Baseline Rate (A): 145 bpm Variability: Moderate Accelerations: 15 x 15 Decelerations: None     Contraction Frequency (min): irritability  Impression: reactive   Labs:  Results for orders placed or performed during the hospital encounter of 11/19/16  Chlamydia/NGC rt PCR ([facility] only)  Result Value Ref Range   Specimen source GC/Chlam ENDOCERVICAL    Chlamydia Tr NOT DETECTED NOT DETECTED   N gonorrhoeae NOT DETECTED NOT DETECTED  Urinalysis, Routine w reflex microscopic  Result Value Ref Range   Color, Urine YELLOW (A) YELLOW   APPearance CLEAR (A) CLEAR   Specific Gravity, Urine 1.014 1.005 - 1.030   pH 7.0 5.0 - 8.0   Glucose, UA NEGATIVE NEGATIVE mg/dL   Hgb urine dipstick NEGATIVE NEGATIVE   Bilirubin Urine NEGATIVE NEGATIVE   Ketones, ur NEGATIVE NEGATIVE mg/dL   Protein, ur NEGATIVE NEGATIVE mg/dL   Nitrite NEGATIVE NEGATIVE   Leukocytes, UA TRACE (A) NEGATIVE   RBC / HPF 0-5 0 - 5 RBC/hpf   WBC, UA 0-5 0 - 5 WBC/hpf   Bacteria, UA NONE SEEN NONE SEEN   Squamous Epithelial / LPF 0-5 (A) NONE SEEN   Mucous PRESENT     Plan: NST performed was reviewed and was found to be reactive. Attempted to get dating scan but Ultrasound areabacked up, note that patient would not be able to receive until in a.m.  [facility] to initiate prenatal care.   She was discharged home with bleeding/labor precautions.  Follow up for routine prenatal care as scheduled.      Hildred LaserAnika Dickey Caamano, MD [facility]

## 2016-11-20 DIAGNOSIS — R109 Unspecified abdominal pain: Secondary | ICD-10-CM

## 2016-11-20 DIAGNOSIS — O26899 Other specified pregnancy related conditions, unspecified trimester: Secondary | ICD-10-CM

## 2016-11-20 LAB — CHLAMYDIA/NGC RT PCR (ARMC ONLY)
CHLAMYDIA TR: NOT DETECTED
N gonorrhoeae: NOT DETECTED

## 2016-11-20 LAB — URINALYSIS, ROUTINE W REFLEX MICROSCOPIC
BILIRUBIN URINE: NEGATIVE
Bacteria, UA: NONE SEEN
Glucose, UA: NEGATIVE mg/dL
Hgb urine dipstick: NEGATIVE
Ketones, ur: NEGATIVE mg/dL
NITRITE: NEGATIVE
PH: 7 (ref 5.0–8.0)
Protein, ur: NEGATIVE mg/dL
SPECIFIC GRAVITY, URINE: 1.014 (ref 1.005–1.030)

## 2016-11-20 NOTE — OB Triage Note (Signed)
Patient came in for observation for lower abdominal pain and back pain. Patient denies contractions. Patient denies leaking of fluid,denies vaginal bleeding and spotting.  Patient states she has been feeling the baby move. Patient does not have any prenatal care during this pregnancy. Vital signs stable and patient afebrile. FHR baseline 135 with moderate variability with accelerations 15 x 15 and no decelerations. Family at bedside. Will continue to monitor.

## 2016-11-20 NOTE — Discharge Summary (Signed)
Patient discharged with instructions on labor precautions, medication for abdominal pain, follow up appointment, abdominal pain during pregnancy, and oral hydration. Patient ambulatory at discharge with steady gait and no complaints. Patient discharged with family.

## 2016-12-02 ENCOUNTER — Observation Stay: Payer: Self-pay

## 2016-12-02 ENCOUNTER — Observation Stay
Admission: EM | Admit: 2016-12-02 | Discharge: 2016-12-02 | Disposition: A | Discharge status: Home / Self Care | Payer: Self-pay | Attending: Obstetrics and Gynecology | Admitting: Obstetrics and Gynecology

## 2016-12-02 DIAGNOSIS — O0933 Supervision of pregnancy with insufficient antenatal care, third trimester: Secondary | ICD-10-CM

## 2016-12-02 DIAGNOSIS — O4703 False labor before 37 completed weeks of gestation, third trimester: Principal | ICD-10-CM | POA: Insufficient documentation

## 2016-12-02 DIAGNOSIS — O479 False labor, unspecified: Secondary | ICD-10-CM | POA: Diagnosis present

## 2016-12-02 DIAGNOSIS — Z3A36 36 weeks gestation of pregnancy: Secondary | ICD-10-CM | POA: Insufficient documentation

## 2016-12-02 LAB — URINALYSIS, ROUTINE W REFLEX MICROSCOPIC
BILIRUBIN URINE: NEGATIVE
Bacteria, UA: NONE SEEN
Glucose, UA: NEGATIVE mg/dL
Hgb urine dipstick: NEGATIVE
Ketones, ur: 5 mg/dL — AB
Nitrite: NEGATIVE
PH: 6 (ref 5.0–8.0)
Protein, ur: 30 mg/dL — AB
Specific Gravity, Urine: 1.023 (ref 1.005–1.030)

## 2016-12-02 LAB — CBC
HCT: 26.9 % — ABNORMAL LOW (ref 35.0–47.0)
Hemoglobin: 8.8 g/dL — ABNORMAL LOW (ref 12.0–16.0)
MCH: 23.3 pg — AB (ref 26.0–34.0)
MCHC: 32.9 g/dL (ref 32.0–36.0)
MCV: 70.9 fL — ABNORMAL LOW (ref 80.0–100.0)
PLATELETS: 274 10*3/uL (ref 150–440)
RBC: 3.79 MIL/uL — AB (ref 3.80–5.20)
RDW: 16.6 % — AB (ref 11.5–14.5)
WBC: 8.9 10*3/uL (ref 3.6–11.0)

## 2016-12-02 LAB — HEPATITIS B SURFACE ANTIGEN: HEP B S AG: NEGATIVE

## 2016-12-02 LAB — URINE DRUG SCREEN, QUALITATIVE (ARMC ONLY)
AMPHETAMINES, UR SCREEN: NOT DETECTED
Barbiturates, Ur Screen: NOT DETECTED
Benzodiazepine, Ur Scrn: NOT DETECTED
COCAINE METABOLITE, UR ~~LOC~~: NOT DETECTED
Cannabinoid 50 Ng, Ur ~~LOC~~: NOT DETECTED
MDMA (ECSTASY) UR SCREEN: NOT DETECTED
METHADONE SCREEN, URINE: NOT DETECTED
OPIATE, UR SCREEN: NOT DETECTED
Phencyclidine (PCP) Ur S: NOT DETECTED
TRICYCLIC, UR SCREEN: NOT DETECTED

## 2016-12-02 LAB — TYPE AND SCREEN
ABO/RH(D): O POS
ANTIBODY SCREEN: NEGATIVE

## 2016-12-02 LAB — CHLAMYDIA/NGC RT PCR (ARMC ONLY)
CHLAMYDIA TR: NOT DETECTED
N GONORRHOEAE: NOT DETECTED

## 2016-12-02 LAB — TSH: TSH: 1.567 u[IU]/mL (ref 0.350–4.500)

## 2016-12-02 MED ORDER — HYDROCODONE-ACETAMINOPHEN 5-325 MG PO TABS
1.0000 | ORAL_TABLET | ORAL | Status: DC | PRN
  Administered 2016-12-02: 1 via ORAL
  Filled 2016-12-02: qty 1

## 2016-12-02 NOTE — Final Progress Note (Signed)
L&D OB Triage Note  Maria MuttonYessica Gates is a 31 y.o. [phone] female at unknown gestation, No LMP recorded (patient cannot recall), who presented to triage for complaints of increased pelvic pressure and contractions.  Patient with no PNC to date, states that she is about 39-40 weeks.  She was evaluated by the nurses with no significant findings for labor. Vital signs stable. An NST was performed and has been reviewed by MD.   NST INTERPRETATION: Indications: rule out uterine contractions  Mode: External Baseline Rate (A): 147 bpm Variability: Moderate Accelerations: 15 x 15 Decelerations: None     Contraction Frequency (min): irritability  Impression: reactive    Imaging:  CLINICAL DATA:  No prenatal care.  Patient third trimester.  EXAM: OBSTETRICAL ULTRASOUND >14 WKS  FINDINGS: Number of Fetuses: 1  Heart Rate:  154 bpm  Movement: Yes  Presentation: Cephalic  Previa: No  Placental Location: Left posterior  Amniotic Fluid (Subjective): Normal  Amniotic Fluid (Objective):  AFI 19.1 cm (5%ile= 7.3 cm, 95%= 23.9 cm for 38 wks)  FETAL BIOMETRY  BPD:  9.14cm 37w 0d  HC:    32.9cm  37w   2d  AC:   33.8cm  37w   4d  FL:   6.6cm  33w   6d  Current Mean GA: 35w 6d              US EDC: 12/31/2016  Estimated Fetal Weight:  3012g    30.1%ile  FETAL ANATOMY  Lateral Ventricles: Appears normal  Thalami/CSP: Appears normal  Posterior Fossa:  Appears normal  Upper Lip: Appears normal  Spine: Appears normal  4 Chamber Heart on Left: Appears normal  LVOT: Limited views appear normal  RVOT: Limited views appear normal  Stomach on Left: Appears normal  3 Vessel Cord: Appears normal  Cord Insertion site: Appears normal  Kidneys: Appears normal  Bladder: Appears normal  Extremities: Appears normal  Sex: Female  Technically difficult due to: Fetal position.  Maternal Findings:  No adnexal mass.  IMPRESSION: 1.  Single live intrauterine pregnancy as detailed above.   Plan: NST performed was reviewed and was found to be reactive. Ultrasound noting patient to be approximately [redacted] weeks gestation.  Prenatal labs ordered. She was discharged home with bleeding/labor precautions.  Instructed to f/u in my office this week for initiation of PNC.      Maria LaserAnika Lacye Mccarn, MD  [facility].

## 2016-12-02 NOTE — OB Triage Note (Signed)
Pt/ presents c/o increased pressure and ctx. Pt states she has had no PNC and is about 39-40 weeks. [facility]. Pt says ctx started around 0130 this morning. Denies any bleeding, or LOF. Reports decreased fetal movement today.

## 2016-12-02 NOTE — Discharge Instructions (Signed)
Braxton Hicks Contractions °Contractions of the uterus can occur throughout pregnancy. Contractions are not always a sign that you are in labor.  °WHAT ARE BRAXTON HICKS CONTRACTIONS?  °Contractions that occur before labor are called Braxton Hicks contractions, or false labor. Toward the end of pregnancy (32-34 weeks), these contractions can develop more often and may become more forceful. This is not true labor because these contractions do not result in opening (dilatation) and thinning of the cervix. They are sometimes difficult to tell apart from true labor because these contractions can be forceful and people have different pain tolerances. You should not feel embarrassed if you go to the hospital with false labor. Sometimes, the only way to tell if you are in true labor is for your health care provider to look for changes in the cervix. °If there are no prenatal problems or other health problems associated with the pregnancy, it is completely safe to be sent home with false labor and await the onset of true labor. °HOW CAN YOU TELL THE DIFFERENCE BETWEEN TRUE AND FALSE LABOR? °False Labor  °· The contractions of false labor are usually shorter and not as hard as those of true labor.   °· The contractions are usually irregular.   °· The contractions are often felt in the front of the lower abdomen and in the groin.   °· The contractions may go away when you walk around or change positions while lying down.   °· The contractions get weaker and are shorter lasting as time goes on.   °· The contractions do not usually become progressively stronger, regular, and closer together as with true labor.   °True Labor  °· Contractions in true labor last 30-70 seconds, become very regular, usually become more intense, and increase in frequency.   °· The contractions do not go away with walking.   °· The discomfort is usually felt in the top of the uterus and spreads to the lower abdomen and low back.   °· True labor can be  determined by your health care provider with an exam. This will show that the cervix is dilating and getting thinner.   °WHAT TO REMEMBER °· Keep up with your usual exercises and follow other instructions given by your health care provider.   °· Take medicines as directed by your health care provider.   °· Keep your regular prenatal appointments.   °· Eat and drink lightly if you think you are going into labor.   °· If Braxton Hicks contractions are making you uncomfortable:   °¨ Change your position from lying down or resting to walking, or from walking to resting.   °¨ Sit and rest in a tub of warm water.   °¨ Drink 2-3 glasses of water. Dehydration may cause these contractions.   °¨ Do slow and deep breathing several times an hour.   °WHEN SHOULD I SEEK IMMEDIATE MEDICAL CARE? °Seek immediate medical care if: °· Your contractions become stronger, more regular, and closer together.   °· You have fluid leaking or gushing from your vagina.   °· You have a fever.   °· You pass blood-tinged mucus.   °· You have vaginal bleeding.   °· You have continuous abdominal pain.   °· You have low back pain that you never had before.   °· You feel your baby's head pushing down and causing pelvic pressure.   °· Your baby is not moving as much as it used to.   °This information is not intended to replace advice given to you by your health care provider. Make sure you discuss any questions you have with your health care   provider. °Document Released: 11/10/2005 Document Revised: 03/03/2016 Document Reviewed: 08/22/2013 °Elsevier Interactive Patient Education © 2017 Elsevier Inc. ° °

## 2016-12-03 LAB — RUBELLA SCREEN: RUBELLA: 1.25 {index} (ref >0.99)

## 2016-12-03 LAB — SICKLE CELL SCREEN: SICKLE CELL SCREEN: NEGATIVE

## 2016-12-03 LAB — VARICELLA ZOSTER ANTIBODY, IGG: Varicella IgG: <135 index — ABNORMAL LOW (ref >165)

## 2016-12-03 LAB — HEMOGLOBIN A1C
HEMOGLOBIN A1C: 5.2 % (ref 4.8–5.6)
Mean Plasma Glucose: 103 mg/dL

## 2016-12-03 LAB — RPR: RPR Ser Ql: NONREACTIVE

## 2016-12-03 LAB — HIV ANTIBODY (ROUTINE TESTING W REFLEX): HIV SCREEN 4TH GENERATION: NONREACTIVE

## 2016-12-04 ENCOUNTER — Ambulatory Visit (INDEPENDENT_AMBULATORY_CARE_PROVIDER_SITE_OTHER): Payer: Self-pay | Admitting: Certified Nurse Midwife

## 2016-12-04 ENCOUNTER — Encounter: Payer: Self-pay | Admitting: Certified Nurse Midwife

## 2016-12-04 VITALS — BP 117/66 | HR 77 | Wt 154.2 lb

## 2016-12-04 DIAGNOSIS — O0933 Supervision of pregnancy with insufficient antenatal care, third trimester: Secondary | ICD-10-CM

## 2016-12-04 DIAGNOSIS — O0940 Supervision of pregnancy with grand multiparity, unspecified trimester: Secondary | ICD-10-CM

## 2016-12-04 DIAGNOSIS — O99019 Anemia complicating pregnancy, unspecified trimester: Secondary | ICD-10-CM

## 2016-12-04 DIAGNOSIS — Z3A36 36 weeks gestation of pregnancy: Secondary | ICD-10-CM

## 2016-12-04 DIAGNOSIS — Z23 Encounter for immunization: Secondary | ICD-10-CM

## 2016-12-04 LAB — POCT URINALYSIS DIPSTICK
GLUCOSE UA: NEGATIVE
PROTEIN UA: NEGATIVE
SPEC GRAV UA: 1.02
pH, UA: 5

## 2016-12-04 LAB — CULTURE, BETA STREP (GROUP B ONLY)

## 2016-12-04 MED ORDER — FUSION PLUS PO CAPS: 1.0000 | ORAL_CAPSULE | Freq: Every day | ORAL | 1 refills | Status: DC

## 2016-12-04 NOTE — Progress Notes (Signed)
NEW OB HISTORY AND PHYSICAL  SUBJECTIVE:       Maria Gates is a 31 y.o. [phone] female, No LMP recorded (approximate). Patient is pregnant., Estimated Date of Delivery: 12/31/16, 6948w1d, presents today for establishment of Prenatal Care.  She has no unusual complaints and complains of abdominal pain in the lower midline and backache associated with pregnancy.   Pt has taken Tylenol and used warm baths at home with moderate relief.   [facility] with [facility].    Gynecologic History No LMP recorded (approximate). Patient is pregnant. Normal Contraception: condoms Last Pap: 1 year ago. Results were: normal  Obstetric History OB History  Gravida Para Term Preterm AB Living  6 4     1 4   SAB TAB Ectopic Multiple Live Births  1       4    # Outcome Date GA Lbr Len/2nd Weight Sex Delivery Anes PTL Lv  6 Current           5 Para 2016    F Vag-Spont  N LIV  4 Para 07/02/13    F    LIV  3 SAB 08/06/12 1672w6d   F      2 Para 10/24/11    F Vag-Spont   LIV  1 Para 04/16/09    F Vag-Spont   LIV      Past Medical History:  Diagnosis Date  . Medical history non-contributory     No past surgical history on file.  No current outpatient prescriptions on file prior to visit.   No current facility-administered medications on file prior to visit.     No Known Allergies  Social History   Social History  . Marital status: Single    Spouse name: N/A  . Number of children: N/A  . Years of education: N/A   Occupational History  . Not on file.   Social History Main Topics  . Smoking status: Never Smoker  . Smokeless tobacco: Never Used  . Alcohol use No  . Drug use: No  . Sexual activity: Yes   Other Topics Concern  . Not on file   Social History Narrative  . No narrative on file    Family History  Problem Relation Age of Onset  . Hypertension Maternal Grandmother     The following portions of the patient's history were reviewed  and updated as appropriate: allergies, current medications, past OB history, past medical history, past surgical history, past family history, past social history, and problem list.    OBJECTIVE: Initial Physical Exam (New OB)  GENERAL APPEARANCE: alert, well appearing, in no apparent distress, oriented to person, place and time HEAD: normocephalic, atraumatic MOUTH: mucous membranes moist, pharynx normal without lesions THYROID: no thyromegaly or masses present BREASTS: not examined LUNGS: clear to auscultation, no wheezes, rales or rhonchi, symmetric air entry HEART: regular rate and rhythm, no murmurs ABDOMEN: soft, nontender, nondistended, no abnormal masses, no epigastric pain, fundus soft, nontender 36 weeks size and FHT present EXTREMITIES: no redness or tenderness in the calves or thighs, no edema SKIN: normal coloration and turgor, no rashes LYMPH NODES: not examined NEUROLOGIC: alert, oriented, normal speech, no focal findings or movement disorder noted PELVIC EXAM not examined  ASSESSMENT: Normal pregnancy Late prenatal care Limited prenatal care Need for immunization Anemia Round ligament pain  PLAN: Prenatal care, ROB x 1 wk Vaccinations-Flu and TDaP Iron supplementation-Fusion Plus daily M-F  Pregnancy Care Manager notified See orders  Gunnar BullaJenkins Maria Gates, CNM

## 2016-12-04 NOTE — Patient Instructions (Signed)
Introduction Patient Name: ________________________________________________ Patient Due Date: ____________________ What is a fetal movement count? A fetal movement count is the number of times that you feel your baby move during a certain amount of time. This may also be called a fetal kick count. A fetal movement count is recommended for every pregnant woman. You may be asked to start counting fetal movements as early as week 28 of your pregnancy. Pay attention to when your baby is most active. You may notice your baby's sleep and wake cycles. You may also notice things that make your baby move more. You should do a fetal movement count:  When your baby is normally most active.  At the same time each day. A good time to count movements is while you are resting, after having something to eat and drink. How do I count fetal movements? 1. Find a quiet, comfortable area. Sit, or lie down on your side. 2. Write down the date, the start time and stop time, and the number of movements that you felt between those two times. Take this information with you to your health care visits. 3. For 2 hours, count kicks, flutters, swishes, rolls, and jabs. You should feel at least 10 movements during 2 hours. 4. You may stop counting after you have felt 10 movements. 5. If you do not feel 10 movements in 2 hours, have something to eat and drink. Then, keep resting and counting for 1 hour. If you feel at least 4 movements during that hour, you may stop counting. Contact a health care provider if:  You feel fewer than 4 movements in 2 hours.  Your baby is not moving like he or she usually does. Date: ____________ Start time: ____________ Stop time: ____________ Movements: ____________ Date: ____________ Start time: ____________ Stop time: ____________ Movements: ____________ Date: ____________ Start time: ____________ Stop time: ____________ Movements: ____________ Date: ____________ Start time: ____________  Stop time: ____________ Movements: ____________ Date: ____________ Start time: ____________ Stop time: ____________ Movements: ____________ Date: ____________ Start time: ____________ Stop time: ____________ Movements: ____________ Date: ____________ Start time: ____________ Stop time: ____________ Movements: ____________ Date: ____________ Start time: ____________ Stop time: ____________ Movements: ____________ Date: ____________ Start time: ____________ Stop time: ____________ Movements: ____________ This information is not intended to replace advice given to you by your health care provider. Make sure you discuss any questions you have with your health care provider. Document Released: 12/10/2006 Document Revised: 07/09/2016 Document Reviewed: 12/20/2015 Elsevier Interactive Patient Education  2017 Elsevier Inc.  

## 2016-12-08 ENCOUNTER — Telehealth: Payer: Self-pay | Admitting: Certified Nurse Midwife

## 2016-12-08 DIAGNOSIS — Z3A37 37 weeks gestation of pregnancy: Secondary | ICD-10-CM

## 2016-12-08 NOTE — Telephone Encounter (Signed)
Per Designated party release, HIPPA approved voicemail left asking patient about Glucola testing at next office visit. Advised to call back with questions.    Serafina RoyalsMichelle Rena Hunke, CNM

## 2016-12-11 ENCOUNTER — Encounter: Payer: Self-pay | Admitting: Certified Nurse Midwife

## 2017-01-02 ENCOUNTER — Inpatient Hospital Stay
Admission: EM | Admit: 2017-01-02 | Discharge: 2017-01-04 | DRG: 766 | Disposition: A | Discharge status: Home / Self Care | Payer: Medicaid Other | Attending: Obstetrics and Gynecology | Admitting: Obstetrics and Gynecology

## 2017-01-02 ENCOUNTER — Encounter: Payer: Self-pay | Admitting: *Deleted

## 2017-01-02 ENCOUNTER — Encounter
Admission: EM | Disposition: A | Discharge status: Home / Self Care | Payer: Self-pay | Source: Home / Self Care | Attending: Obstetrics and Gynecology

## 2017-01-02 ENCOUNTER — Inpatient Hospital Stay: Payer: Medicaid Other | Admitting: Anesthesiology

## 2017-01-02 DIAGNOSIS — O3663X0 Maternal care for excessive fetal growth, third trimester, not applicable or unspecified: Secondary | ICD-10-CM | POA: Diagnosis present

## 2017-01-02 DIAGNOSIS — O324XX0 Maternal care for high head at term, not applicable or unspecified: Secondary | ICD-10-CM | POA: Diagnosis present

## 2017-01-02 DIAGNOSIS — O323XX0 Maternal care for face, brow and chin presentation, not applicable or unspecified: Secondary | ICD-10-CM | POA: Diagnosis present

## 2017-01-02 DIAGNOSIS — D649 Anemia, unspecified: Secondary | ICD-10-CM | POA: Diagnosis present

## 2017-01-02 DIAGNOSIS — O9902 Anemia complicating childbirth: Secondary | ICD-10-CM | POA: Diagnosis present

## 2017-01-02 DIAGNOSIS — O34219 Maternal care for unspecified type scar from previous cesarean delivery: Secondary | ICD-10-CM | POA: Diagnosis present

## 2017-01-02 DIAGNOSIS — Z3A40 40 weeks gestation of pregnancy: Secondary | ICD-10-CM

## 2017-01-02 DIAGNOSIS — Z3493 Encounter for supervision of normal pregnancy, unspecified, third trimester: Secondary | ICD-10-CM | POA: Diagnosis present

## 2017-01-02 LAB — CBC
HEMATOCRIT: 28.4 % — AB (ref 35.0–47.0)
Hemoglobin: 9.3 g/dL — ABNORMAL LOW (ref 12.0–16.0)
MCH: 23.1 pg — ABNORMAL LOW (ref 26.0–34.0)
MCHC: 32.6 g/dL (ref 32.0–36.0)
MCV: 70.7 fL — AB (ref 80.0–100.0)
Platelets: 263 10*3/uL (ref 150–440)
RBC: 4.01 MIL/uL (ref 3.80–5.20)
RDW: 17.9 % — AB (ref 11.5–14.5)
WBC: 10 10*3/uL (ref 3.6–11.0)

## 2017-01-02 SURGERY — Surgical Case
Anesthesia: Epidural | Wound class: Clean Contaminated

## 2017-01-02 MED ORDER — OXYTOCIN BOLUS FROM INFUSION: 500.0000 mL | Freq: Once | INTRAVENOUS | Status: DC

## 2017-01-02 MED ORDER — ACETAMINOPHEN 325 MG PO TABS
650.0000 mg | ORAL_TABLET | ORAL | Status: DC | PRN
  Administered 2017-01-03: 650 mg via ORAL
  Filled 2017-01-02: qty 2

## 2017-01-02 MED ORDER — CEFAZOLIN SODIUM-DEXTROSE 2-4 GM/100ML-% IV SOLN: 2.0000 g | Freq: Once | INTRAVENOUS | Status: DC

## 2017-01-02 MED ORDER — DIPHENHYDRAMINE HCL 25 MG PO CAPS: 25.0000 mg | ORAL_CAPSULE | ORAL | Status: DC | PRN

## 2017-01-02 MED ORDER — ONDANSETRON HCL 4 MG/2ML IJ SOLN: 4.0000 mg | Freq: Three times a day (TID) | INTRAMUSCULAR | Status: DC | PRN

## 2017-01-02 MED ORDER — SODIUM CHLORIDE 0.9 % IV SOLN: 2.0000 g | INTRAVENOUS | Status: DC

## 2017-01-02 MED ORDER — ACETAMINOPHEN 325 MG PO TABS: 650.0000 mg | ORAL_TABLET | ORAL | Status: DC | PRN

## 2017-01-02 MED ORDER — BUPIVACAINE HCL (PF) 0.25 % IJ SOLN
INTRAMUSCULAR | Status: DC | PRN
  Administered 2017-01-02: 1 mL via INTRATHECAL
  Administered 2017-01-02 (×2): 5 mL via EPIDURAL

## 2017-01-02 MED ORDER — OXYTOCIN 40 UNITS IN LACTATED RINGERS INFUSION - SIMPLE MED
INTRAVENOUS | Status: AC
Start: 2017-01-02 — End: 2017-01-02
  Filled 2017-01-02: qty 1000

## 2017-01-02 MED ORDER — KETOROLAC TROMETHAMINE 30 MG/ML IJ SOLN: 30.0000 mg | Freq: Four times a day (QID) | INTRAMUSCULAR | Status: AC

## 2017-01-02 MED ORDER — IBUPROFEN 600 MG PO TABS: 600.0000 mg | ORAL_TABLET | Freq: Four times a day (QID) | ORAL | Status: DC

## 2017-01-02 MED ORDER — FENTANYL CITRATE (PF) 100 MCG/2ML IJ SOLN
INTRAMUSCULAR | Status: AC
  Filled 2017-01-02: qty 2

## 2017-01-02 MED ORDER — OXYCODONE HCL 5 MG PO TABS: 10.0000 mg | ORAL_TABLET | ORAL | Status: DC | PRN

## 2017-01-02 MED ORDER — LIDOCAINE-EPINEPHRINE (PF) 1.5 %-1:200000 IJ SOLN
INTRAMUSCULAR | Status: DC | PRN
  Administered 2017-01-02: 3 mL

## 2017-01-02 MED ORDER — FENTANYL 2.5 MCG/ML W/ROPIVACAINE 0.2% IN NS 100 ML EPIDURAL INFUSION (ARMC-ANES)
10.0000 mL/h | EPIDURAL | Status: DC
  Filled 2017-01-02: qty 100

## 2017-01-02 MED ORDER — NALBUPHINE HCL 10 MG/ML IJ SOLN: 5.0000 mg | INTRAMUSCULAR | Status: DC | PRN

## 2017-01-02 MED ORDER — ONDANSETRON HCL 4 MG/2ML IJ SOLN: 4.0000 mg | Freq: Four times a day (QID) | INTRAMUSCULAR | Status: DC | PRN

## 2017-01-02 MED ORDER — TETANUS-DIPHTH-ACELL PERTUSSIS 5-2.5-18.5 LF-MCG/0.5 IM SUSP: 0.5000 mL | Freq: Once | INTRAMUSCULAR | Status: DC

## 2017-01-02 MED ORDER — NALBUPHINE HCL 10 MG/ML IJ SOLN: 5.0000 mg | Freq: Once | INTRAMUSCULAR | Status: DC | PRN

## 2017-01-02 MED ORDER — MORPHINE SULFATE-NACL 0.5-0.9 MG/ML-% IV SOSY
PREFILLED_SYRINGE | INTRAVENOUS | Status: DC | PRN
  Administered 2017-01-02: 3 mg via EPIDURAL

## 2017-01-02 MED ORDER — DIBUCAINE 1 % RE OINT: 1.0000 "application " | TOPICAL_OINTMENT | RECTAL | Status: DC | PRN

## 2017-01-02 MED ORDER — DIPHENHYDRAMINE HCL 50 MG/ML IJ SOLN: 12.5000 mg | INTRAMUSCULAR | Status: DC | PRN

## 2017-01-02 MED ORDER — ONDANSETRON HCL 4 MG/2ML IJ SOLN
4.0000 mg | Freq: Three times a day (TID) | INTRAMUSCULAR | Status: DC | PRN
  Administered 2017-01-02: 4 mg via INTRAVENOUS
  Filled 2017-01-02: qty 2

## 2017-01-02 MED ORDER — PHENYLEPHRINE HCL 10 MG/ML IJ SOLN
INTRAMUSCULAR | Status: AC
  Filled 2017-01-02: qty 1

## 2017-01-02 MED ORDER — ONDANSETRON HCL 4 MG/2ML IJ SOLN
INTRAMUSCULAR | Status: DC | PRN
  Administered 2017-01-02: 4 mg via INTRAVENOUS

## 2017-01-02 MED ORDER — LACTATED RINGERS IV SOLN
INTRAVENOUS | Status: DC
  Administered 2017-01-02 – 2017-01-03 (×2): via INTRAVENOUS

## 2017-01-02 MED ORDER — SCOPOLAMINE 1 MG/3DAYS TD PT72
1.0000 | MEDICATED_PATCH | Freq: Once | TRANSDERMAL | Status: DC
  Administered 2017-01-02: 1.5 mg via TRANSDERMAL
  Filled 2017-01-02: qty 1

## 2017-01-02 MED ORDER — MENTHOL 3 MG MT LOZG: 1.0000 | LOZENGE | OROMUCOSAL | Status: DC | PRN

## 2017-01-02 MED ORDER — NALOXONE HCL 0.4 MG/ML IJ SOLN: 0.4000 mg | INTRAMUSCULAR | Status: DC | PRN

## 2017-01-02 MED ORDER — OXYCODONE HCL 5 MG PO TABS: 5.0000 mg | ORAL_TABLET | ORAL | Status: DC | PRN

## 2017-01-02 MED ORDER — ACETAMINOPHEN 325 MG PO TABS
650.0000 mg | ORAL_TABLET | Freq: Four times a day (QID) | ORAL | Status: AC
  Administered 2017-01-02: 650 mg via ORAL
  Filled 2017-01-02: qty 2

## 2017-01-02 MED ORDER — WITCH HAZEL-GLYCERIN EX PADS: 1.0000 "application " | MEDICATED_PAD | CUTANEOUS | Status: DC | PRN

## 2017-01-02 MED ORDER — PHENYLEPHRINE 40 MCG/ML (10ML) SYRINGE FOR IV PUSH (FOR BLOOD PRESSURE SUPPORT)
PREFILLED_SYRINGE | INTRAVENOUS | Status: DC | PRN
  Administered 2017-01-02: 200 ug via INTRAVENOUS
  Administered 2017-01-02: 300 ug via INTRAVENOUS
  Administered 2017-01-02 (×3): 200 ug via INTRAVENOUS

## 2017-01-02 MED ORDER — OXYCODONE-ACETAMINOPHEN 5-325 MG PO TABS: 1.0000 | ORAL_TABLET | ORAL | Status: DC | PRN

## 2017-01-02 MED ORDER — MISOPROSTOL 200 MCG PO TABS
ORAL_TABLET | ORAL | Status: AC
  Filled 2017-01-02: qty 4

## 2017-01-02 MED ORDER — CEFTRIAXONE SODIUM-DEXTROSE 2-2.22 GM-% IV SOLR
2.0000 g | INTRAVENOUS | Status: DC
  Filled 2017-01-02: qty 50

## 2017-01-02 MED ORDER — CEFAZOLIN SODIUM-DEXTROSE 2-3 GM-% IV SOLR
2.0000 g | Freq: Once | INTRAVENOUS | Status: DC
  Filled 2017-01-02: qty 50

## 2017-01-02 MED ORDER — LACTATED RINGERS IV SOLN: INTRAVENOUS | Status: DC

## 2017-01-02 MED ORDER — PRENATAL MULTIVITAMIN CH
1.0000 | ORAL_TABLET | Freq: Every day | ORAL | Status: DC
  Administered 2017-01-03 – 2017-01-04 (×2): 1 via ORAL
  Filled 2017-01-02 (×2): qty 1

## 2017-01-02 MED ORDER — SENNOSIDES-DOCUSATE SODIUM 8.6-50 MG PO TABS
2.0000 | ORAL_TABLET | ORAL | Status: DC
  Administered 2017-01-03 – 2017-01-04 (×2): 2 via ORAL
  Filled 2017-01-02 (×2): qty 2
  Filled 2017-01-02: qty 1

## 2017-01-02 MED ORDER — AMMONIA AROMATIC IN INHA
RESPIRATORY_TRACT | Status: AC
  Filled 2017-01-02: qty 10

## 2017-01-02 MED ORDER — ZOLPIDEM TARTRATE 5 MG PO TABS: 5.0000 mg | ORAL_TABLET | Freq: Every evening | ORAL | Status: DC | PRN

## 2017-01-02 MED ORDER — OXYTOCIN 40 UNITS IN LACTATED RINGERS INFUSION - SIMPLE MED: 2.5000 [IU]/h | INTRAVENOUS | Status: DC

## 2017-01-02 MED ORDER — LACTATED RINGERS IV SOLN: 500.0000 mL | INTRAVENOUS | Status: DC | PRN

## 2017-01-02 MED ORDER — SOD CITRATE-CITRIC ACID 500-334 MG/5ML PO SOLN
30.0000 mL | ORAL | Status: AC
  Administered 2017-01-02: 30 mL via ORAL

## 2017-01-02 MED ORDER — NALOXONE HCL 2 MG/2ML IJ SOSY: 1.0000 ug/kg/h | PREFILLED_SYRINGE | INTRAVENOUS | Status: DC | PRN

## 2017-01-02 MED ORDER — SODIUM CHLORIDE 0.9% FLUSH: 3.0000 mL | INTRAVENOUS | Status: DC | PRN

## 2017-01-02 MED ORDER — OXYCODONE-ACETAMINOPHEN 5-325 MG PO TABS: 2.0000 | ORAL_TABLET | ORAL | Status: DC | PRN

## 2017-01-02 MED ORDER — SIMETHICONE 80 MG PO CHEW
80.0000 mg | CHEWABLE_TABLET | Freq: Four times a day (QID) | ORAL | Status: DC
  Administered 2017-01-02 – 2017-01-04 (×9): 80 mg via ORAL
  Filled 2017-01-02 (×9): qty 1

## 2017-01-02 MED ORDER — KETOROLAC TROMETHAMINE 30 MG/ML IJ SOLN
INTRAMUSCULAR | Status: AC
  Filled 2017-01-02: qty 1

## 2017-01-02 MED ORDER — FENTANYL CITRATE (PF) 100 MCG/2ML IJ SOLN
INTRAMUSCULAR | Status: DC | PRN
  Administered 2017-01-02: 100 ug via EPIDURAL

## 2017-01-02 MED ORDER — LACTATED RINGERS IV SOLN
INTRAVENOUS | Status: DC | PRN
  Administered 2017-01-02: 06:00:00 via INTRAVENOUS

## 2017-01-02 MED ORDER — LIDOCAINE HCL (PF) 1 % IJ SOLN: 30.0000 mL | INTRAMUSCULAR | Status: DC | PRN

## 2017-01-02 MED ORDER — OXYCODONE-ACETAMINOPHEN 5-325 MG PO TABS
2.0000 | ORAL_TABLET | ORAL | Status: DC | PRN
  Administered 2017-01-03: 2 via ORAL
  Filled 2017-01-02: qty 2

## 2017-01-02 MED ORDER — ROPIVACAINE HCL 2 MG/ML IJ SOLN
10.0000 mL/h | INTRAMUSCULAR | Status: DC
  Filled 2017-01-02: qty 5

## 2017-01-02 MED ORDER — LIDOCAINE-EPINEPHRINE (PF) 2 %-1:200000 IJ SOLN
INTRAMUSCULAR | Status: DC | PRN
  Administered 2017-01-02: 5 mL via INTRADERMAL
  Administered 2017-01-02: 5 mL
  Administered 2017-01-02: 3 mL
  Administered 2017-01-02: 5 mL

## 2017-01-02 MED ORDER — FENTANYL 2.5 MCG/ML W/ROPIVACAINE 0.2% IN NS 100 ML EPIDURAL INFUSION (ARMC-ANES)
EPIDURAL | Status: DC | PRN
  Administered 2017-01-02: 10 mL/h via EPIDURAL

## 2017-01-02 MED ORDER — LIDOCAINE HCL (PF) 1 % IJ SOLN
INTRAMUSCULAR | Status: AC
  Filled 2017-01-02: qty 30

## 2017-01-02 MED ORDER — ONDANSETRON HCL 4 MG/2ML IJ SOLN
INTRAMUSCULAR | Status: AC
  Filled 2017-01-02: qty 2

## 2017-01-02 MED ORDER — MORPHINE SULFATE (PF) 0.5 MG/ML IJ SOLN
INTRAMUSCULAR | Status: AC
  Filled 2017-01-02: qty 10

## 2017-01-02 MED ORDER — DIPHENHYDRAMINE HCL 25 MG PO CAPS
25.0000 mg | ORAL_CAPSULE | Freq: Four times a day (QID) | ORAL | Status: DC | PRN
Start: 2017-01-02 — End: 2017-01-05

## 2017-01-02 MED ORDER — CEFAZOLIN SODIUM 1 G IJ SOLR
INTRAMUSCULAR | Status: DC | PRN
  Administered 2017-01-02: 2 g

## 2017-01-02 MED ORDER — SOD CITRATE-CITRIC ACID 500-334 MG/5ML PO SOLN
30.0000 mL | ORAL | Status: DC | PRN
  Filled 2017-01-02: qty 15

## 2017-01-02 MED ORDER — MEPERIDINE HCL 25 MG/ML IJ SOLN: 6.2500 mg | INTRAMUSCULAR | Status: DC | PRN

## 2017-01-02 MED ORDER — KETOROLAC TROMETHAMINE 30 MG/ML IJ SOLN
30.0000 mg | Freq: Four times a day (QID) | INTRAMUSCULAR | Status: AC
  Administered 2017-01-02 – 2017-01-03 (×4): 30 mg via INTRAVENOUS
  Filled 2017-01-02 (×3): qty 1

## 2017-01-02 MED ORDER — OXYTOCIN 40 UNITS IN LACTATED RINGERS INFUSION - SIMPLE MED
2.5000 [IU]/h | INTRAVENOUS | Status: DC
  Administered 2017-01-02: 1000 mL via INTRAVENOUS

## 2017-01-02 MED ORDER — CLINDAMYCIN PHOSPHATE 900 MG/50ML IV SOLN: 900.0000 mg | Freq: Once | INTRAVENOUS | Status: DC

## 2017-01-02 MED ORDER — COCONUT OIL OIL: 1.0000 "application " | TOPICAL_OIL | Status: DC | PRN

## 2017-01-02 MED ORDER — OXYCODONE-ACETAMINOPHEN 5-325 MG PO TABS
1.0000 | ORAL_TABLET | ORAL | Status: DC | PRN
  Administered 2017-01-03 – 2017-01-04 (×6): 1 via ORAL
  Filled 2017-01-02 (×5): qty 1

## 2017-01-02 MED ORDER — OXYTOCIN 10 UNIT/ML IJ SOLN
INTRAMUSCULAR | Status: DC
Start: 2017-01-02 — End: 2017-01-02
  Filled 2017-01-02: qty 2

## 2017-01-02 SURGICAL SUPPLY — 33 items
ADHESIVE MASTISOL STRL (MISCELLANEOUS) IMPLANT
BAG COUNTER SPONGE EZ (MISCELLANEOUS) ×2 IMPLANT
CANISTER SUCT 3000ML (MISCELLANEOUS) ×3 IMPLANT
CELL SAVER LIPIGURD (MISCELLANEOUS) ×1 IMPLANT
CHLORAPREP W/TINT 26ML (MISCELLANEOUS) ×6 IMPLANT
CLOSURE WOUND 1/2 X4 (GAUZE/BANDAGES/DRESSINGS) ×1
COUNTER SPONGE BAG EZ (MISCELLANEOUS) ×1
DRSG OPSITE POSTOP 4X10 (GAUZE/BANDAGES/DRESSINGS) ×3 IMPLANT
DRSG TELFA 3X8 NADH (GAUZE/BANDAGES/DRESSINGS) IMPLANT
EXTRT SYSTEM ALEXIS 14CM (MISCELLANEOUS) ×3
GAUZE SPONGE 4X4 12PLY STRL (GAUZE/BANDAGES/DRESSINGS) IMPLANT
GLOVE INDICATOR 7.0 STRL GRN (GLOVE) ×3 IMPLANT
GLOVE ORTHO TXT STRL SZ7.5 (GLOVE) ×3 IMPLANT
GLOVE PROTEXIS LATEX SZ 7.5 (GLOVE) ×3 IMPLANT
GOWN STRL REUS W/ TWL LRG LVL3 (GOWN DISPOSABLE) ×2 IMPLANT
GOWN STRL REUS W/TWL LRG LVL3 (GOWN DISPOSABLE) ×4
KIT RM TURNOVER STRD PROC AR (KITS) ×3 IMPLANT
NS IRRIG 1000ML POUR BTL (IV SOLUTION) ×3 IMPLANT
PACK C SECTION AR (MISCELLANEOUS) ×3 IMPLANT
PAD OB MATERNITY 4.3X12.25 (PERSONAL CARE ITEMS) ×3 IMPLANT
PAD PREP 24X41 OB/GYN DISP (PERSONAL CARE ITEMS) ×3 IMPLANT
RTRCTR C-SECT PINK 25CM LRG (MISCELLANEOUS) ×3 IMPLANT
SPONGE LAP 18X18 5 PK (GAUZE/BANDAGES/DRESSINGS) ×3 IMPLANT
SPONGE XRAY 4X4 16PLY STRL (MISCELLANEOUS) ×3 IMPLANT
STRIP CLOSURE SKIN 1/2X4 (GAUZE/BANDAGES/DRESSINGS) ×2 IMPLANT
SUCT VACUUM KIWI BELL (SUCTIONS) ×3 IMPLANT
SUT PDS AB 1 TP1 96 (SUTURE) ×6 IMPLANT
SUT VIC AB 0 CTX 36 (SUTURE) ×2
SUT VIC AB 0 CTX36XBRD ANBCTRL (SUTURE) ×1 IMPLANT
SUT VIC AB 1 CT1 36 (SUTURE) ×3 IMPLANT
SUT VICRYL 3-0 (SUTURE) ×3 IMPLANT
SUT VICRYL 3-0 27IN SH (SUTURE) ×3 IMPLANT
SUT VICRYL+ 3-0 36IN CT-1 (SUTURE) ×9 IMPLANT

## 2017-01-02 NOTE — Anesthesia Procedure Notes (Signed)
Date/Time: 01/02/2017 6:57 AM Performed by: Junious SilkNOLES, Maria Arntz Pre-anesthesia Checklist: Patient identified, Emergency Drugs available, Suction available, Patient being monitored and Timeout performed Oxygen Delivery Method: Nasal cannula

## 2017-01-02 NOTE — Progress Notes (Signed)
LABOR NOTE   Maria Gates 31 y.o.GP@ at 6222w2d Active phase labor.  OBJECTIVE:  BP 134/85   Pulse 94   Temp 98 F (36.7 C) (Oral)   Resp 16   Ht 5' (1.524 m)   Wt 164 lb (74.4 kg)   LMP  (Approximate)   SpO2 100%   BMI 32.03 kg/m  No intake/output data recorded.   CERVIX: 8:75%: -2: mid position: soft SVE:   Dilation: 8 Effacement (%): 90 Station: -2, -3 Exam by:: Evans CONTRACTIONS: regular, every 3-4 minutes FHR: Fetal heart tracing reviewed. Variability: Good {> 6 bpm) Category I   Analgesia: Epidural  Labs: Lab Results  Component Value Date   WBC 8.9 12/02/2016   HGB 8.8 (L) 12/02/2016   HCT 26.9 (L) 12/02/2016   MCV 70.9 (L) 12/02/2016   PLT 274 12/02/2016    ASSESSMENT: 1) Labor curve reviewed.       Progress: Active phase labor.  Now making some cervical change with both dilation and effacement.     Membranes: intact, ruptured      2)  Face presentation - mentum transverse/oblique    PLAN: expectant management - have discussed face with mother.   If conversion to mentum anterior - expect vaginal birth (tested pelvis to 8# 10oz per mother) If lack of decent and no progress or mentum posterior - CD  Elonda Huskyavid J. Evans, M.D. 01/02/2017 4:52 AM

## 2017-01-02 NOTE — H&P (Signed)
History and Physical   HPI  Maria Gates is a 31 y.o. [id number] at 4323w2d Estimated Date of Delivery: 12/31/16 who is being admitted for labor management She has been having contractions.  OB History  Obstetric History   G6   P4   T0   P0   A1   L4    SAB1   TAB0   Ectopic0   Multiple0   Live Births4     # Outcome Date GA Lbr Len/2nd Weight Sex Delivery Anes PTL Lv  6 Current           5 Para 2016    F Vag-Spont  N LIV  4 Para 07/02/13    F    LIV  3 SAB 08/06/12 7126w6d   F      2 Para 10/24/11    F Vag-Spont   LIV  1 Para 04/16/09    F Vag-Spont   LIV       PROBLEM LIST  Pregnancy complications or risks: Patient Active Problem List   Diagnosis Date Noted  . Uterine contractions during pregnancy 12/02/2016  . Abdominal pain in pregnancy 11/19/2016  . Postpartum care following vaginal delivery 11/13/2015  . Term pregnancy 11/11/2015  . Labor and delivery, indication for care 10/23/2015  . Supervision of high risk pregnancy in third trimester 10/23/2015  . Insufficient prenatal care 10/23/2015  . [redacted] weeks gestation of pregnancy 10/23/2015  . Choroid plexus cyst of fetus on prenatal ultrasound 08/09/2015  . Indication for care in labor and delivery, antepartum 07/31/2015    Prenatal labs and studies: ABO, Rh: --/--/O POS (01/09 1137) Antibody: NEG (01/09 1137) Rubella: 1.25 (01/09 1137) RPR: Non Reactive (01/09 1137)  HBsAg: Negative (01/09 1137)  HIV: Non Reactive (01/09 1137)     Past Medical History:  Diagnosis Date  . Medical history non-contributory      History reviewed. No pertinent surgical history.   Medications    Current Discharge Medication List    CONTINUE these medications which have NOT CHANGED   Details  acetaminophen (TYLENOL) 325 MG tablet Take 650 mg by mouth every 6 (six) hours as needed.    Iron-FA-B Cmp-C-Biot-Probiotic (FUSION PLUS) CAPS Take 1 capsule by mouth daily. Qty: 60 capsule, Refills: 1    Prenatal Vit-Fe Fumarate-FA  (PRENATAL MULTIVITAMIN) TABS tablet Take 1 tablet by mouth daily at 12 noon.         Allergies  Patient has no known allergies.  Review of Systems  C/o abdominal pain c/w contractions   Physical Exam  BP 114/83   Pulse 94   Temp 98 F (36.7 C) (Oral)   Resp 16   Ht 5' (1.524 m)   Wt 164 lb (74.4 kg)   LMP  (Approximate)   BMI 32.03 kg/m    Lungs:  CTA B Cardio: RRR without M/R/G Abd: Soft, gravid, NT Presentation: cephalic EXT: No C/C/ 1+ Edema DTRs: 2+ B CERVIX: 8:75%: -2: anterior: soft  See Prenatal records for more detailed PE.     FHR:  Variability: Good {> 6 bpm)  Toco: Uterine Contractions: Frequency: Every 5-8 minutes   Test Results  No results found for this or any previous visit (from the past 24 hour(s)).   Assessment  Maria MuttonYessica Gates is a 31 y.o. [id number] at 1223w2d Estimated Date of Delivery: 12/31/16  The fetus is reassuring. One prenatal visit.  Patient Active Problem List   Diagnosis Date Noted  . Uterine contractions during pregnancy 12/02/2016  .  Abdominal pain in pregnancy 11/19/2016  . Postpartum care following vaginal delivery 11/13/2015  . Term pregnancy 11/11/2015  . Labor and delivery, indication for care 10/23/2015  . Supervision of high risk pregnancy in third trimester 10/23/2015  . Insufficient prenatal care 10/23/2015  . [redacted] weeks gestation of pregnancy 10/23/2015  . Choroid plexus cyst of fetus on prenatal ultrasound 08/09/2015  . Indication for care in labor and delivery, antepartum 07/31/2015    Plan  1. Admit to L&D for anticipate vaginal delivery 2. EFM: -- Category 1  Elonda Husky, M.D. 01/02/2017 2:45 AM

## 2017-01-02 NOTE — Plan of Care (Signed)
Pericare and bedbath completed. New pads placed, gown changed.  [facility] in stable condition with her mother at side.  Infant in NBN at this time with RN TE.  Pt moving all four extremities at this time/no pain per pt.  Ellison Carwin Mindi Akerson RNC

## 2017-01-02 NOTE — Transfer of Care (Signed)
Immediate Anesthesia Transfer of Care Note  Patient: Maria Gates  Procedure(s) Performed: Procedure(s): CESAREAN SECTION (N/A)  Patient Location: PACU  Anesthesia Type:Epidural  Level of Consciousness: awake, alert  and oriented  Airway & Oxygen Therapy: Patient Spontanous Breathing  Post-op Assessment: Report given to RN and Post -op Vital signs reviewed and stable  Post vital signs: Reviewed and stable  Last Vitals:  Vitals:   01/02/17 0805 01/02/17 0806  BP: 101/61   Pulse:  75  Resp:  (!) 6  Temp:      Last Pain:  Vitals:   01/02/17 0800  TempSrc: Oral  PainSc: 0-No pain         Complications: No apparent anesthesia complications

## 2017-01-02 NOTE — Progress Notes (Signed)
LABOR NOTE   Maria MuttonYessica Gates 31 y.o.GP@ at 6663w2d Active phase labor.  SUBJECTIVE:  She presented today in labor OBJECTIVE:  BP 114/83   Pulse 94   Temp 98 F (36.7 C) (Oral)   Resp 16   Ht 5' (1.524 m)   Wt 164 lb (74.4 kg)   LMP  (Approximate)   BMI 32.03 kg/m  No intake/output data recorded.  She has not shown cervical change. CERVIX: 5:50%: -3: mid position: soft SVE:   Dilation: 6.5 Effacement (%): 50, 60 Station: -2, -3 Exam by:: MBS CONTRACTIONS: irregular, every 5-8 minutes FHR: Fetal heart tracing reviewed. Variability: Good {> 6 bpm) Category I   Analgesia: Epidural  Labs: Lab Results  Component Value Date   WBC 8.9 12/02/2016   HGB 8.8 (L) 12/02/2016   HCT 26.9 (L) 12/02/2016   MCV 70.9 (L) 12/02/2016   PLT 274 12/02/2016    ASSESSMENT: 1) Labor curve reviewed.       Progress: Active phase labor. But contracting rarely.     Membranes: AROM - Meconium and blood       PLAN: IV Pitocin augmentation if lack of cervical change Follow FHR tracing  Elonda Huskyavid J. Totiana Everson, M.D. 01/02/2017 3:20 AM

## 2017-01-02 NOTE — Anesthesia Post-op Follow-up Note (Cosign Needed)
Anesthesia QCDR form completed.        

## 2017-01-02 NOTE — Op Note (Signed)
OP NOTE  Date @EDTODAYDATE @ @TIMESTAMP @ Name Jabier MuttonYessica Pilot MR# [id number]  Preoperative Diagnosis: 1. Intrauterine pregnancy at 1711w2d Active Problems:   Arrested active phase of labor  2.  malpresentation: Face - mentum oblique  Postoperative Diagnosis: 1. Intrauterine pregnancy at 4211w2d, delivered 2. Viable infant 3. Remainder same as pre-op   Procedure: 1. Primary Low-Transverse Cesarean Section  Surgeon: Elonda Huskyavid J. Jane Broughton, MD  Assistant:    Anesthesia: epidural  EBL: 700 mL  Findings: 1. Female infant in the cephalic, Face presentation, with weight 9# 2oz, Apgars of 7 at 1 minute and 9 at 5 minutes.  2. Normal uterus, tubes and ovaries.    Procedure:  The patient was prepped and draped in the supine position and placed under spinal anesthesia.  A transverse incision was made across the abdomen in a Pfannenstiel manner. If indicated the old scar was systematically removed with sharp dissection.  We carried the dissection down to the level of the fascia.  The fascia was incised in a curvilinear manner.  The fascia was then elevated from the rectus muscles with blunt and sharp dissection.  The rectus muscles were separated laterally exposing the peritoneum.  The peritoneum was carefully entered with care being taken to avoid bowel and bladder.  A self-retaining retractor was placed.  The visceral peritoneum was incised in a curvilinear fashion across the lower uterine segment creating a bladder flap. A transverse incision was made across the lower uterine segment and extended laterally and superiorly using the bandage scissors.  Artificial rupture membranes was performed and Meconium fluid was noted.  The infant was delivered from the cephalic, Face presentation position.  A nuchal cordwas found and reduced.  The infant was immediately bulb suctioned.  The cord was doubly clamped and cut. Cord blood was obtained.  The infant was handed to the pediatrician who then placed the infant  under heat lamps where it was cleaned dried and re-suctioned. The placenta was delivered. The hysterotomy incision was then identified on ring forceps.  The uterine cavity was cleaned with a moist lap sponge.  The hysterotomy incision was closed with a running interlocking suture of Vicryl.  Hemostasis was excellent.  Pitocin was run in the IV and the uterus was found to be firm. The posterior cul-de-sac and gutters were cleaned and inspected.  Hemostasis was noted.  The fascia was then closed with a running suture of #1 Vicryl.  Hemostasis of the subcutaneous tissues was obtained using the Bovie.  The subcutaneous tissues were closed with a running suture of 000 Vicryl.  A subcuticular suture was placed.  Steri-Strips were applied in the usual manner.  A pressure dressing was placed.  The patient went to the recovery room in stable condition.   Elonda Huskyavid J. Hadeel Hillebrand, M.D. 01/02/2017 6:21 AM

## 2017-01-02 NOTE — Anesthesia Procedure Notes (Signed)
Epidural Patient location during procedure: OB Start time: 01/02/2017 2:40 AM End time: 01/02/2017 3:05 AM  Staffing Anesthesiologist: Alver FisherPENWARDEN, Horrace Hanak Performed: anesthesiologist   Preanesthetic Checklist Completed: patient identified, site marked, surgical consent, pre-op evaluation, timeout performed, IV checked, risks and benefits discussed and monitors and equipment checked  Epidural Patient position: sitting Prep: ChloraPrep Patient monitoring: heart rate, continuous pulse ox and blood pressure Approach: midline Location: L3-L4 Injection technique: LOR saline  Needle:  Needle type: Tuohy  Needle gauge: 18 G Needle length: 9 cm and 9 Needle insertion depth: 5 cm Catheter type: closed end flexible Catheter size: 20 Guage Catheter at skin depth: 9 cm Test dose: negative  Assessment Events: blood not aspirated, injection not painful, no injection resistance, negative IV test and no paresthesia  Additional Notes CSE performed with needle through needle technique, 1mL 0.25% bupivacaine given for spinal dose  Patient tolerated the insertion well without complications.Reason for block:procedure for pain

## 2017-01-02 NOTE — Anesthesia Preprocedure Evaluation (Signed)
Anesthesia Evaluation  Patient identified by MRN, date of birth, ID band Patient awake    Reviewed: Allergy & Precautions, NPO status , Patient's Chart, lab work & pertinent test results  History of Anesthesia Complications Negative for: history of anesthetic complications  Airway Mallampati: II  TM Distance: >3 FB Neck ROM: Full    Dental no notable dental hx.    Pulmonary neg pulmonary ROS, neg sleep apnea, neg COPD,    breath sounds clear to auscultation- rhonchi (-) wheezing      Cardiovascular Exercise Tolerance: Good (-) hypertension(-) CAD and (-) Past MI  Rhythm:Regular Rate:Normal - Systolic murmurs and - Diastolic murmurs    Neuro/Psych negative neurological ROS  negative psych ROS   GI/Hepatic negative GI ROS, Neg liver ROS,   Endo/Other  negative endocrine ROSneg diabetes  Renal/GU negative Renal ROS     Musculoskeletal negative musculoskeletal ROS (+)   Abdominal (+) - obese, Gravid abdomen  Peds  Hematology negative hematology ROS (+)   Anesthesia Other Findings   Reproductive/Obstetrics (+) Pregnancy                             Anesthesia Physical Anesthesia Plan  ASA: II  Anesthesia Plan: Epidural   Post-op Pain Management:    Induction:   Airway Management Planned:   Additional Equipment:   Intra-op Plan:   Post-operative Plan:   Informed Consent: I have reviewed the patients History and Physical, chart, labs and discussed the procedure including the risks, benefits and alternatives for the proposed anesthesia with the patient or authorized representative who has indicated his/her understanding and acceptance.     Plan Discussed with: Anesthesiologist  Anesthesia Plan Comments: (Plan for epidural for labor, discussed epidural vs spinal vs GA if need for csection)        Lab Results  Component Value Date   WBC 8.9 12/02/2016   HGB 8.8 (L)  12/02/2016   HCT 26.9 (L) 12/02/2016   MCV 70.9 (L) 12/02/2016   PLT 274 12/02/2016    Anesthesia Quick Evaluation

## 2017-01-02 NOTE — OB Triage Note (Signed)
Recvd pt from ED. C/o contractions every 5 min and white discharge. Pt was nitrazine neg. Pt feeling baby move ok. No intercourse in the past 24 hours.

## 2017-01-03 LAB — CBC
HCT: 18.6 % — ABNORMAL LOW (ref 35.0–47.0)
Hemoglobin: 6.1 g/dL — ABNORMAL LOW (ref 12.0–16.0)
MCH: 23 pg — AB (ref 26.0–34.0)
MCHC: 32.6 g/dL (ref 32.0–36.0)
MCV: 70.5 fL — ABNORMAL LOW (ref 80.0–100.0)
PLATELETS: 196 10*3/uL (ref 150–440)
RBC: 2.64 MIL/uL — AB (ref 3.80–5.20)
RDW: 18 % — ABNORMAL HIGH (ref 11.5–14.5)
WBC: 8.9 10*3/uL (ref 3.6–11.0)

## 2017-01-03 LAB — PREPARE RBC (CROSSMATCH)

## 2017-01-03 LAB — RPR: RPR Ser Ql: NONREACTIVE

## 2017-01-03 MED ORDER — IBUPROFEN 600 MG PO TABS
600.0000 mg | ORAL_TABLET | Freq: Four times a day (QID) | ORAL | Status: DC | PRN
  Administered 2017-01-03 – 2017-01-04 (×6): 600 mg via ORAL
  Filled 2017-01-03 (×6): qty 1

## 2017-01-03 MED ORDER — FERROUS SULFATE 325 (65 FE) MG PO TABS
325.0000 mg | ORAL_TABLET | Freq: Two times a day (BID) | ORAL | Status: DC
  Administered 2017-01-03 – 2017-01-04 (×3): 325 mg via ORAL
  Filled 2017-01-03 (×3): qty 1

## 2017-01-03 MED ORDER — OXYCODONE-ACETAMINOPHEN 5-325 MG PO TABS
1.0000 | ORAL_TABLET | ORAL | Status: DC | PRN
  Filled 2017-01-03: qty 1

## 2017-01-03 MED ORDER — SODIUM CHLORIDE 0.9 % IV SOLN
Freq: Once | INTRAVENOUS | Status: AC
  Administered 2017-01-03: 15:00:00 via INTRAVENOUS

## 2017-01-03 NOTE — Progress Notes (Signed)
Progress Note - Cesarean Delivery  Maria Gates is a 31 y.o. [phone] now PP day 1 s/p C-Section, Low Transverse .   Subjective:  Patient reports no problems with eating, bowel movements, voiding, or their wound Limited OOB so far. Pain controlled.  CD reviewed.  Objective:  Vital signs in last 24 hours: Temp:  [97.9 F (36.6 C)-98.7 F (37.1 C)] 98.5 F (36.9 C) (02/10 0725) Pulse Rate:  [62-83] 72 (02/10 0725) Resp:  [12-20] 16 (02/10 0725) BP: (88-110)/(44-67) 88/49 (02/10 0725) SpO2:  [98 %-100 %] 98 % (02/10 0725)  Physical Exam:  General: alert and no distress Lochia: appropriate Uterine Fundus: firm Incision: dressing intact DVT Evaluation: No evidence of DVT seen on physical exam.    Data Review  Recent Labs  01/02/17 0228  HGB 9.3*  HCT 28.4*    Assessment:  Active Problems:   Labor and delivery, indication for care   Arrested active phase of labor   Delivery by cesarean section using transverse incision of lower segment of uterus   Status post Cesarean section. Doing well postoperatively.     Plan:       Continue current care.  OOB, PO pain meds  Probable D/C tomorrow.  Elonda Huskyavid J. Yves Fodor, M.D. 01/03/2017 9:51 AM

## 2017-01-03 NOTE — Anesthesia Postprocedure Evaluation (Signed)
Anesthesia Post Note  Patient: Maria Gates  Procedure(s) Performed: Procedure(s) (LRB): CESAREAN SECTION (N/A)  Patient location during evaluation: Mother Baby Anesthesia Type: Epidural Level of consciousness: awake, awake and alert and oriented Pain management: pain level controlled Vital Signs Assessment: post-procedure vital signs reviewed and stable Respiratory status: spontaneous breathing and nonlabored ventilation Cardiovascular status: stable Postop Assessment: no headache Anesthetic complications: no     Last Vitals:  Vitals:   01/03/17 0725 01/03/17 1210  BP: (!) 88/49 (!) 96/58  Pulse: 72 70  Resp: 16 18  Temp: 36.9 C 37 C    Last Pain:  Vitals:   01/03/17 1345  TempSrc:   PainSc: 6                  Vedant Shehadeh,  Jadyn Barge R

## 2017-01-03 NOTE — Anesthesia Post-op Follow-up Note (Signed)
  Anesthesia Pain Follow-up Note  Patient: Maria Gates  Day #: 1  Date of Follow-up: 01/03/2017 Time: 2:10 PM  Last Vitals:  Vitals:   01/03/17 0725 01/03/17 1210  BP: (!) 88/49 (!) 96/58  Pulse: 72 70  Resp: 16 18  Temp: 36.9 C 37 C    Level of Consciousness: alert  Pain: none   Side Effects:None  Catheter Site Exam:clean, dry, no drainage     Plan: Continue current therapy of postop epidural at surgeon's request  Dangela How,  Sheran FavaMark R

## 2017-01-04 LAB — TYPE AND SCREEN
BLOOD PRODUCT EXPIRATION DATE: 201802142359
Blood Product Expiration Date: 201802282359
ISSUE DATE / TIME: 201802101614
ISSUE DATE / TIME: 201802101834
UNIT TYPE AND RH: 5100
Unit Type and Rh: 9500

## 2017-01-04 MED ORDER — FERROUS SULFATE 325 (65 FE) MG PO TABS: 325.0000 mg | ORAL_TABLET | Freq: Two times a day (BID) | ORAL | 1 refills | Status: AC

## 2017-01-04 MED ORDER — IBUPROFEN 600 MG PO TABS: 600.0000 mg | ORAL_TABLET | Freq: Four times a day (QID) | ORAL | 0 refills | Status: DC | PRN

## 2017-01-04 MED ORDER — IBUPROFEN 600 MG PO TABS: 600.0000 mg | ORAL_TABLET | Freq: Four times a day (QID) | ORAL | 3 refills | Status: DC | PRN

## 2017-01-04 MED ORDER — OXYCODONE-ACETAMINOPHEN 5-325 MG PO TABS
1.0000 | ORAL_TABLET | Freq: Four times a day (QID) | ORAL | 0 refills | Status: DC | PRN
Start: 2017-01-04 — End: 2019-02-24

## 2017-01-04 NOTE — Discharge Summary (Signed)
Physician Obstetric Discharge Summary  Patient ID: Maria MuttonYessica Humm MRN: [id number] DOB/AGE: 05/16/1986 31 y.o.   Date of Admission: 01/02/2017  Date of Discharge:   Admitting Diagnosis: Onset of Labor at 233w2d  Secondary Diagnosis: Anemia in pregnancy and Insufficient prenatal care  Mode of Delivery: primary cesarean sectionlow uterine, transverse     Discharge Diagnosis: Reasons for cesarean section  Arrest of Descent, Macrosomia  9# 2 oz and Malpresentation face   Intrapartum Procedures: Atificial rupture of membranes and epidural   Post partum procedures: blood transfusion  Complications: none                        Discharge Day SOAP Note:  Subjective:  The patient has no complaints.  She is ambulating well. She is taking PO well. Pain is well controlled with current medications. Patient is urinating without difficulty.   She is passing flatus.    Objective  Vital signs in last 24 hours: BP (!) 106/52   Pulse 66   Temp 98.1 F (36.7 C) (Oral)   Resp 18   Ht 5' (1.524 m)   Wt 164 lb (74.4 kg)   LMP  (Approximate)   SpO2 99%   Breastfeeding? Unknown   BMI 32.03 kg/m   Physical Exam: Gen: NAD Abdomen: Soft, NT - dressing intatct  Fundus Fundal Tone: Firm  Lochia Amount: Small     Data Review Labs: CBC Latest Ref Rng & Units 01/03/2017 01/02/2017 12/02/2016  WBC 3.6 - 11.0 K/uL 8.9 10.0 8.9  Hemoglobin 12.0 - 16.0 g/dL 6.1(L) 9.3(L) 8.8(L)  Hematocrit 35.0 - 47.0 % 18.6(L) 28.4(L) 26.9(L)  Platelets 150 - 440 K/uL 196 263 274   O POS  Assessment:  Active Problems:   Labor and delivery, indication for care   Arrested active phase of labor   Delivery by cesarean section using transverse incision of lower segment of uterus   Doing well.  Normal progress as expected.    Plan:  Discharge to home  Modified rest as directed - may slowly resume normal activities with restrictions  as discussed.  Medications as written.  See below for additional.        Discharge Instructions: Per After Visit Summary. Activity: Advance as tolerated. Pelvic rest for 6 weeks.  Also refer to After Visit Summary.  Wound care discussed. Diet: Regular   Outpatient follow up:  Follow-up Information    Brennan Baileyavid Quron Ruddy, MD Follow up in 1 week(s).   Specialty:  Obstetrics and Gynecology Contact information: [address] [place] [id number] [phone]          Postpartum contraception: discuss at office visit  Discharged Condition: good  Discharged to: home  Newborn Data: Disposition:home with mother  Apgars: APGAR (1 MIN): 7   APGAR (5 MINS): 9   APGAR (10 MINS):    Baby Feeding: Breast  Elonda Huskyavid J. Eduarda Scrivens, M.D. 01/04/2017 10:58 AM

## 2017-01-04 NOTE — Progress Notes (Signed)
D/C to home with Pt's Parent's. Pt. Is alert and oriented with aprop. Affect. Color good, Skin w&d. BBS Clear. Abd. Is soft with positive bowel sounds. Abd. Incision has Honeycomb dressing in place. Pt. Refuses to have dressing taken off, stating that she wants to take her shower when she gets home and take the dressing off at that time. Fundus is firm at U/-1 with scant Lochia.Pos. Pedal pulses equal and strong with Cap. Refill < 2 sec. I read all D/C Instructions to Pt. And she V/O and demonstrated understanding via the Teach back Method.  Pt. Taken taken downstairs in wheel chair by Danaher CorporationMathewson RN.

## 2018-08-12 DIAGNOSIS — Z3491 Encounter for supervision of normal pregnancy, unspecified, first trimester: Secondary | ICD-10-CM | POA: Insufficient documentation

## 2018-11-24 NOTE — L&D Delivery Note (Signed)
Delivery Note  Date of delivery: 02/22/2019 Estimated Date of Delivery: 02/12/19 Patient's last menstrual period was 05/08/2018. EGA: [redacted]w[redacted]d  First Stage: Induction/Augmentation: pitocin, AROM Analgesia /Anesthesia intrapartum: epidural AROM at 0318  Maria Gates presented to L&D with pelvic pressure and advanced cervical dilation at late term. She was induced with AROM and pitocin. Epidural placed for pain relief.   Second Stage: Complete dilation at 0904 Onset of pushing at 0916 FHR second stage: category II Delivery at 0924 on 02/22/2019  She progressed to complete and had a spontaneous vaginal birth of a live female over an intact perineum. The fetal head was delivered in direct OA position with restitution to ROA. No nuchal cord. Anterior then posterior shoulders delivered spontaneously. Baby placed on mom's abdomen and attended to by transition RN. Cord clamped and cut after 2 minute delay by myself. Cord blood obtained for newborn labs.  Third Stage: Placenta delivered spontaneously intact with 3VC at 0931 Placenta disposition: routine disposal Uterine tone firm / bleeding scant IV pitocin and IM methergine given for hemorrhage prophylaxis  Left labial laceration identified  Anesthesia for repair: epidrual Repair: 3-0 Vicryl Rapide Est. Blood Loss (mL): 300  Complications: none  Mom to postpartum.  Baby to Couplet care / Skin to Skin.  Newborn: Birth Weight: pending  Apgar Scores: 9, 9 Feeding planned: breast and formula   Genia Del, CNM 02/22/2019 9:44 AM

## 2019-01-11 ENCOUNTER — Observation Stay
Admission: EM | Admit: 2019-01-11 | Discharge: 2019-01-12 | Disposition: A | Discharge status: Home / Self Care | Payer: Medicaid Other | Attending: Obstetrics & Gynecology | Admitting: Obstetrics & Gynecology

## 2019-01-11 ENCOUNTER — Other Ambulatory Visit: Payer: Self-pay

## 2019-01-11 DIAGNOSIS — O0930 Supervision of pregnancy with insufficient antenatal care, unspecified trimester: Secondary | ICD-10-CM

## 2019-01-11 DIAGNOSIS — D509 Iron deficiency anemia, unspecified: Secondary | ICD-10-CM

## 2019-01-11 DIAGNOSIS — Z3A35 35 weeks gestation of pregnancy: Secondary | ICD-10-CM | POA: Insufficient documentation

## 2019-01-11 DIAGNOSIS — O34219 Maternal care for unspecified type scar from previous cesarean delivery: Secondary | ICD-10-CM | POA: Diagnosis present

## 2019-01-11 HISTORY — DX: Iron deficiency anemia, unspecified: D50.9

## 2019-01-11 LAB — URINALYSIS, ROUTINE W REFLEX MICROSCOPIC
Bilirubin Urine: NEGATIVE
Glucose, UA: NEGATIVE mg/dL
Hgb urine dipstick: NEGATIVE
Ketones, ur: 5 mg/dL — AB
Nitrite: NEGATIVE
PROTEIN: NEGATIVE mg/dL
Specific Gravity, Urine: 1.013 (ref 1.005–1.030)
pH: 6 (ref 5.0–8.0)

## 2019-01-11 LAB — URINE DRUG SCREEN, QUALITATIVE (ARMC ONLY)
Amphetamines, Ur Screen: NOT DETECTED
Barbiturates, Ur Screen: NOT DETECTED
Benzodiazepine, Ur Scrn: NOT DETECTED
COCAINE METABOLITE, UR ~~LOC~~: NOT DETECTED
Cannabinoid 50 Ng, Ur ~~LOC~~: NOT DETECTED
MDMA (ECSTASY) UR SCREEN: NOT DETECTED
Methadone Scn, Ur: NOT DETECTED
Opiate, Ur Screen: NOT DETECTED
Phencyclidine (PCP) Ur S: NOT DETECTED
Tricyclic, Ur Screen: NOT DETECTED

## 2019-01-11 LAB — DIFFERENTIAL
ABS IMMATURE GRANULOCYTES: 0.03 10*3/uL (ref 0.00–0.07)
Basophils Absolute: 0 10*3/uL (ref 0.0–0.1)
Basophils Relative: 0 %
Eosinophils Absolute: 0.3 10*3/uL (ref 0.0–0.5)
Eosinophils Relative: 3 %
Immature Granulocytes: 0 %
Lymphocytes Relative: 30 %
Lymphs Abs: 2.3 10*3/uL (ref 0.7–4.0)
Monocytes Absolute: 0.6 10*3/uL (ref 0.1–1.0)
Monocytes Relative: 7 %
Neutro Abs: 4.6 10*3/uL (ref 1.7–7.7)
Neutrophils Relative %: 60 %

## 2019-01-11 LAB — CHLAMYDIA/NGC RT PCR (ARMC ONLY)
Chlamydia Tr: NOT DETECTED
N gonorrhoeae: NOT DETECTED

## 2019-01-11 LAB — COMPREHENSIVE METABOLIC PANEL
ALBUMIN: 2.7 g/dL — AB (ref 3.5–5.0)
ALT: 9 U/L (ref 0–44)
AST: 23 U/L (ref 15–41)
Alkaline Phosphatase: 64 U/L (ref 38–126)
Anion gap: 5 (ref 5–15)
BUN: 5 mg/dL — ABNORMAL LOW (ref 6–20)
CO2: 21 mmol/L — ABNORMAL LOW (ref 22–32)
Calcium: 8 mg/dL — ABNORMAL LOW (ref 8.9–10.3)
Chloride: 107 mmol/L (ref 98–111)
Creatinine, Ser: 0.32 mg/dL — ABNORMAL LOW (ref 0.44–1.00)
GFR calc Af Amer: >60 mL/min (ref >60)
GFR calc non Af Amer: >60 mL/min (ref >60)
GLUCOSE: 99 mg/dL (ref 70–99)
POTASSIUM: 3.3 mmol/L — AB (ref 3.5–5.1)
SODIUM: 133 mmol/L — AB (ref 135–145)
Total Bilirubin: 0.3 mg/dL (ref 0.3–1.2)
Total Protein: 6.4 g/dL — ABNORMAL LOW (ref 6.5–8.1)

## 2019-01-11 LAB — WET PREP, GENITAL
Clue Cells Wet Prep HPF POC: NONE SEEN
Sperm: NONE SEEN
Trich, Wet Prep: NONE SEEN
Yeast Wet Prep HPF POC: NONE SEEN

## 2019-01-11 LAB — CBC
HCT: 26.7 % — ABNORMAL LOW (ref 36.0–46.0)
Hemoglobin: 8.2 g/dL — ABNORMAL LOW (ref 12.0–15.0)
MCH: 22.7 pg — ABNORMAL LOW (ref 26.0–34.0)
MCHC: 30.7 g/dL (ref 30.0–36.0)
MCV: 74 fL — ABNORMAL LOW (ref 80.0–100.0)
Platelets: 290 10*3/uL (ref 150–400)
RBC: 3.61 MIL/uL — ABNORMAL LOW (ref 3.87–5.11)
RDW: 15.3 % (ref 11.5–15.5)
WBC: 7.8 10*3/uL (ref 4.0–10.5)
nRBC: 0 % (ref 0.0–0.2)

## 2019-01-11 LAB — RAPID HIV SCREEN (HIV 1/2 AB+AG)
HIV 1/2 Antibodies: NONREACTIVE
HIV-1 P24 Antigen - HIV24: NONREACTIVE

## 2019-01-11 LAB — PREPARE RBC (CROSSMATCH)

## 2019-01-11 LAB — GROUP B STREP BY PCR: GROUP B STREP BY PCR: NEGATIVE

## 2019-01-11 MED ORDER — ACETAMINOPHEN 500 MG PO TABS
1000.0000 mg | ORAL_TABLET | Freq: Once | ORAL | Status: AC
  Administered 2019-01-11: 1000 mg via ORAL
  Filled 2019-01-11: qty 2

## 2019-01-11 MED ORDER — LACTATED RINGERS IV SOLN
Freq: Once | INTRAVENOUS | Status: AC
  Administered 2019-01-11: 15:00:00 via INTRAVENOUS

## 2019-01-11 MED ORDER — BETAMETHASONE SOD PHOS & ACET 6 (3-3) MG/ML IJ SUSP
12.0000 mg | INTRAMUSCULAR | Status: AC
  Administered 2019-01-11 – 2019-01-12 (×2): 12 mg via INTRAMUSCULAR

## 2019-01-11 MED ORDER — SODIUM CHLORIDE 0.9% IV SOLUTION: Freq: Once | INTRAVENOUS | Status: DC

## 2019-01-11 MED ORDER — TERBUTALINE SULFATE 1 MG/ML IJ SOLN
INTRAMUSCULAR | Status: AC
  Administered 2019-01-11: 0.25 mg via SUBCUTANEOUS
  Filled 2019-01-11: qty 1

## 2019-01-11 MED ORDER — BETAMETHASONE SOD PHOS & ACET 6 (3-3) MG/ML IJ SUSP
INTRAMUSCULAR | Status: AC
  Administered 2019-01-11: 12 mg via INTRAMUSCULAR
  Filled 2019-01-11: qty 5

## 2019-01-11 MED ORDER — DIPHENHYDRAMINE HCL 25 MG PO CAPS
25.0000 mg | ORAL_CAPSULE | Freq: Once | ORAL | Status: AC
  Administered 2019-01-11: 25 mg via ORAL
  Filled 2019-01-11: qty 1

## 2019-01-11 MED ORDER — TERBUTALINE SULFATE 1 MG/ML IJ SOLN
0.2500 mg | Freq: Once | INTRAMUSCULAR | Status: AC | PRN
  Administered 2019-01-11: 0.25 mg via SUBCUTANEOUS

## 2019-01-11 MED ORDER — INFLUENZA VAC SPLIT QUAD 0.5 ML IM SUSY
0.5000 mL | PREFILLED_SYRINGE | INTRAMUSCULAR | Status: DC
  Filled 2019-01-11: qty 0.5

## 2019-01-11 NOTE — H&P (Signed)
OB History & Physical   History of Present Illness:  Chief Complaint:   HPI:  Maria Gates is a 33 y.o. [id number] female at [redacted]w[redacted]d dated by sure LMP Patient's last menstrual period was 05/08/2018. Estimated Date of Delivery: 02/12/19  She presents to L&D with contractions.  No prenatal care this pregnancy.  Had ultrasound done at 30 weeks that was consistent with dates.    +FM, + CTX, no LOF, no VB Denies pain between contractions/cramping.  Pregnancy Issues: 1. No prenatal care 2. History of cesarean for face presentation at full dilatation, no VBAC.  4 FT SVD prior. 3. History of macrosomia infant (9lb 2oz), pelvis tested to 8lb 10oz   Maternal Medical History:   Past Medical History:  Diagnosis Date  . Medical history non-contributory   History of chlamydia in prior pregnancy  Past Surgical History:  Procedure Laterality Date  . CESAREAN SECTION N/A 01/02/2017   Procedure: CESAREAN SECTION;  Surgeon: Linzie Collin, MD;  Location: [facility] ORS;  Service: Obstetrics;  Laterality: N/A;   No meds No Known Allergies   Prenatal care site: none  Social History: She  reports that she has never smoked. She has never used smokeless tobacco. She reports that she does not drink alcohol or use drugs.  Family History: family history includes Hypertension in her maternal grandmother.   Review of Systems: A full review of systems was performed and negative except as noted in the HPI.     Physical Exam:  Vital Signs: BP 114/70 (BP Location: Left Arm)   Pulse (!) 102   Temp 98.1 F (36.7 C) (Oral)   Resp 16   Wt 68.9 kg   LMP 05/08/2018   BMI 29.69 kg/m  General: no acute distress.  HEENT: normocephalic, atraumatic Heart: regular rate & rhythm.  No murmurs/rubs/gallops Lungs: clear to auscultation bilaterally, normal respiratory effort Abdomen: soft, gravid, non-tender;  EFW: 5lb 7oz Pelvic:   External: Normal external female genitalia  Cervix: Dilation: 3.5 / Effacement  (%): 50 /      Extremities: non-tender, symmetric, no edema bilaterally.  DTRs: 2+ Neurologic: Alert & oriented x 3.     Pertinent Results:  Prenatal Labs: Blood type/Rh O+  Antibody screen pending  Rubella Immune (2018)  Varicella Non-Immune (2018)  RPR pending  HBsAg pending  HIV pending  GC pending  Chlamydia pending  Genetic screening Not done  1 hour GTT Not done  3 hour GTT   GBS pending   FHT: 130 mod + accels no decels TOCO:  Not able to be traced  SVE:  Dilation: 3.5 / Effacement (%): 50 /      Bedside ultrasound/ BPP AUA: 34+4 EFW: 2440g (5lb 7oz) Cephalic Ample fluid, >2x2cm pocket >3 body movements >1 flexion/extension  NST Baseline:  130 Variability: moderate Accelerations present x >2 Decelerations absent Time >57mins Interpretation: reactive NST , 10/10 BPP     Assessment:  Maria Gates is a 33 y.o. G70P1015 female at [redacted]w[redacted]d with preterm labor, advanced dilation.   Plan:  1. Admit to Labor & Delivery 2. CBC, T&S, Clrs, IVF 3. GBS, GCCT, wet prep collected  4. Betamethasone, terbutaline. 5. Continuous efm/toco 6. Category 1 tracing 7. Mag sulfate not indicated due to gestational age  ----- Ranae Plumber, MD Attending Obstetrician and Gynecologist [facility], Department of OB/GYN [facility]

## 2019-01-11 NOTE — Progress Notes (Signed)
Results for orders placed or performed during the hospital encounter of 01/11/19 (from the past 24 hour(s))  Urine Drug Screen, Qualitative ([facility] only)     Status: None   Collection Time: 01/11/19  2:33 PM  Result Value Ref Range   Tricyclic, Ur Screen NONE DETECTED NONE DETECTED   Amphetamines, Ur Screen NONE DETECTED NONE DETECTED   MDMA (Ecstasy)Ur Screen NONE DETECTED NONE DETECTED   Cocaine Metabolite,Ur [place] NONE DETECTED NONE DETECTED   Opiate, Ur Screen NONE DETECTED NONE DETECTED   Phencyclidine (PCP) Ur S NONE DETECTED NONE DETECTED   Cannabinoid 50 Ng, Ur [place] NONE DETECTED NONE DETECTED   Barbiturates, Ur Screen NONE DETECTED NONE DETECTED   Benzodiazepine, Ur Scrn NONE DETECTED NONE DETECTED   Methadone Scn, Ur NONE DETECTED NONE DETECTED  Urinalysis, Routine w reflex microscopic     Status: Abnormal   Collection Time: 01/11/19  2:33 PM  Result Value Ref Range   Color, Urine YELLOW (A) YELLOW   APPearance HAZY (A) CLEAR   Specific Gravity, Urine 1.013 1.005 - 1.030   pH 6.0 5.0 - 8.0   Glucose, UA NEGATIVE NEGATIVE mg/dL   Hgb urine dipstick NEGATIVE NEGATIVE   Bilirubin Urine NEGATIVE NEGATIVE   Ketones, ur 5 (A) NEGATIVE mg/dL   Protein, ur NEGATIVE NEGATIVE mg/dL   Nitrite NEGATIVE NEGATIVE   Leukocytes,Ua SMALL (A) NEGATIVE   RBC / HPF 0-5 0 - 5 RBC/hpf   WBC, UA 0-5 0 - 5 WBC/hpf   Bacteria, UA RARE (A) NONE SEEN   Squamous Epithelial / LPF 11-20 0 - 5   Mucus PRESENT   Chlamydia/NGC rt PCR ([facility] only)     Status: None   Collection Time: 01/11/19  2:33 PM  Result Value Ref Range   Specimen source GC/Chlam ENDOCERVICAL    Chlamydia Tr NOT DETECTED NOT DETECTED   N gonorrhoeae NOT DETECTED NOT DETECTED  Wet prep, genital     Status: Abnormal   Collection Time: 01/11/19  2:33 PM  Result Value Ref Range   Yeast Wet Prep HPF POC NONE SEEN NONE SEEN   Trich, Wet Prep NONE SEEN NONE SEEN   Clue Cells Wet Prep HPF POC NONE SEEN NONE SEEN   WBC, Wet Prep HPF POC FEW  (A) NONE SEEN   Sperm NONE SEEN   Group B strep by PCR     Status: None   Collection Time: 01/11/19  2:45 PM  Result Value Ref Range   Group B strep by PCR NEGATIVE NEGATIVE  CBC     Status: Abnormal   Collection Time: 01/11/19  2:57 PM  Result Value Ref Range   WBC 7.8 4.0 - 10.5 K/uL   RBC 3.61 (L) 3.87 - 5.11 MIL/uL   Hemoglobin 8.2 (L) 12.0 - 15.0 g/dL   HCT 16.1 (L) 09.6 - 04.5 %   MCV 74.0 (L) 80.0 - 100.0 fL   MCH 22.7 (L) 26.0 - 34.0 pg   MCHC 30.7 30.0 - 36.0 g/dL   RDW 40.9 81.1 - 91.4 %   Platelets 290 150 - 400 K/uL   nRBC 0.0 0.0 - 0.2 %  Differential     Status: None   Collection Time: 01/11/19  2:57 PM  Result Value Ref Range   Neutrophils Relative % 60 %   Neutro Abs 4.6 1.7 - 7.7 K/uL   Lymphocytes Relative 30 %   Lymphs Abs 2.3 0.7 - 4.0 K/uL   Monocytes Relative 7 %   Monocytes  Absolute 0.6 0.1 - 1.0 K/uL   Eosinophils Relative 3 %   Eosinophils Absolute 0.3 0.0 - 0.5 K/uL   Basophils Relative 0 %   Basophils Absolute 0.0 0.0 - 0.1 K/uL   Immature Granulocytes 0 %   Abs Immature Granulocytes 0.03 0.00 - 0.07 K/uL  Rapid HIV screen (HIV 1/2 Ab+Ag)     Status: None   Collection Time: 01/11/19  2:57 PM  Result Value Ref Range   HIV-1 P24 Antigen - HIV24 NON REACTIVE NON REACTIVE   HIV 1/2 Antibodies NON REACTIVE NON REACTIVE   Interpretation (HIV Ag Ab)      A non reactive test result means that HIV 1 or HIV 2 antibodies and HIV 1 p24 antigen were not detected in the specimen.  Type and screen [facility]     Status: None   Collection Time: 01/11/19  2:57 PM  Result Value Ref Range   ABO/RH(D) O POS    Antibody Screen NEG    Sample Expiration      01/14/2019 Performed at [facility], [address] [place], [place] [address]   Comprehensive metabolic panel     Status: Abnormal   Collection Time: 01/11/19  2:57 PM  Result Value Ref Range   Sodium 133 (L) 135 - 145 mmol/L   Potassium 3.3 (L) 3.5 - 5.1 mmol/L    Chloride 107 98 - 111 mmol/L   CO2 21 (L) 22 - 32 mmol/L   Glucose, Bld 99 70 - 99 mg/dL   BUN 5 (L) 6 - 20 mg/dL   Creatinine, Ser 5.03 (L) 0.44 - 1.00 mg/dL   Calcium 8.0 (L) 8.9 - 10.3 mg/dL   Total Protein 6.4 (L) 6.5 - 8.1 g/dL   Albumin 2.7 (L) 3.5 - 5.0 g/dL   AST 23 15 - 41 U/L   ALT 9 0 - 44 U/L   Alkaline Phosphatase 64 38 - 126 U/L   Total Bilirubin 0.3 0.3 - 1.2 mg/dL   GFR calc non Af Amer >60 >60 mL/min   GFR calc Af Amer >60 >60 mL/min   Anion gap 5 5 - 15     Due to close proximity to delivery and significant microcytic anemia, will order 1 unit PRBC for transfusion.   Iron studies and vit b12 pending ----- Ranae Plumber, MD Attending Obstetrician and Gynecologist [facility], Department of OB/GYN [facility]

## 2019-01-11 NOTE — OB Triage Note (Signed)
Pt G7P5 [redacted]w[redacted]d presents to the ED c/o contractions, pt stated they started hurting at about 1300 and come every 6 to 7 minutes and last about a minute. Denies leaking of fluid, vaginal bleeding, or decreased fetal movement. External monitors applied, initial FHR 135, VS obtained and WNL. Provider notified.

## 2019-01-11 NOTE — Progress Notes (Signed)
Notified by RN that there had been a baseline change to 100-110.    Patient received 1000mg  tylenol and 25mg  benadryl prior to blood transfusion for Hb 8.2.  Now receiving 1 u PRBC.  Pt started complaining of pain in left leg radiating down the back of her hip and back of her leg, consistent with sciatic pain.  This prohibited her from lying on her left side due to pain.  Reviewed strip:  Baseline previously 120 +mod var, +accels no decels Just after 19:00 the baseline drifted to 110, with mod var and large accels, no decels.  During the 21:00 hour there was another drift of the baseline, vs subtle and prolonged decels to 90-100s with continued moderate variability and accels.   Toco: irritable  BP 117/62   Pulse 76   Temp 98.4 F (36.9 C) (Oral)   Resp 16   Ht 5\' 5"  (1.651 m)   Wt 68.9 kg   LMP 05/08/2018   SpO2 100%   BMI 25.29 kg/m    7:30pm SVE was unchanged. Exam by me.  Continue close observation,  Continuous toco and efm.  ----- Ranae Plumber, MD Attending Obstetrician and Gynecologist [facility], Department of OB/GYN [facility]'

## 2019-01-12 LAB — HEMOGLOBIN AND HEMATOCRIT, BLOOD
HCT: 30 % — ABNORMAL LOW (ref 36.0–46.0)
HCT: 30.5 % — ABNORMAL LOW (ref 36.0–46.0)
HEMATOCRIT: 28.2 % — AB (ref 36.0–46.0)
Hemoglobin: 8.8 g/dL — ABNORMAL LOW (ref 12.0–15.0)
Hemoglobin: 9.5 g/dL — ABNORMAL LOW (ref 12.0–15.0)
Hemoglobin: 9.5 g/dL — ABNORMAL LOW (ref 12.0–15.0)

## 2019-01-12 LAB — VITAMIN B12: Vitamin B-12: 166 pg/mL — ABNORMAL LOW (ref 180–914)

## 2019-01-12 LAB — RUBELLA SCREEN: Rubella: 1.05 index (ref >0.99)

## 2019-01-12 LAB — IRON AND TIBC
Iron: 35 ug/dL (ref 28–170)
Saturation Ratios: 6 % — ABNORMAL LOW (ref 10.4–31.8)
TIBC: 581 ug/dL — ABNORMAL HIGH (ref 250–450)
UIBC: 546 ug/dL

## 2019-01-12 LAB — HEPATITIS B SURFACE ANTIGEN: Hepatitis B Surface Ag: NEGATIVE

## 2019-01-12 LAB — RPR: RPR Ser Ql: NONREACTIVE

## 2019-01-12 LAB — FERRITIN: Ferritin: 4 ng/mL — ABNORMAL LOW (ref 11–307)

## 2019-01-12 LAB — PREPARE RBC (CROSSMATCH)

## 2019-01-12 LAB — TRANSFERRIN: Transferrin: 425 mg/dL — ABNORMAL HIGH (ref 192–382)

## 2019-01-12 MED ORDER — BETAMETHASONE SOD PHOS & ACET 6 (3-3) MG/ML IJ SUSP
INTRAMUSCULAR | Status: AC
  Filled 2019-01-12: qty 5

## 2019-01-12 MED ORDER — SODIUM CHLORIDE 0.9% IV SOLUTION: Freq: Once | INTRAVENOUS | Status: DC

## 2019-01-13 LAB — TYPE AND SCREEN
ABO/RH(D): O POS
Antibody Screen: NEGATIVE
Unit division: 0
Unit division: 0

## 2019-01-13 LAB — BPAM RBC
Blood Product Expiration Date: 202003082359
Blood Product Expiration Date: 202003152359
ISSUE DATE / TIME: 202002190253
ISSUE DATE / TIME: 202002182030
Unit Type and Rh: 5100
Unit Type and Rh: 5100

## 2019-01-20 ENCOUNTER — Other Ambulatory Visit: Payer: Self-pay

## 2019-01-20 ENCOUNTER — Observation Stay
Admission: EM | Admit: 2019-01-20 | Discharge: 2019-01-20 | Disposition: A | Discharge status: Home / Self Care | Payer: Self-pay | Attending: Obstetrics & Gynecology | Admitting: Obstetrics & Gynecology

## 2019-01-20 DIAGNOSIS — Z3A36 36 weeks gestation of pregnancy: Secondary | ICD-10-CM | POA: Insufficient documentation

## 2019-01-20 NOTE — OB Triage Note (Signed)
Pt G7P5 [redacted]w[redacted]d presents with abd pain/CTX Q 5 min. Reports +FM. Denies bleeding. Small amount of LOF does not soak through pad. Monitors applied. No CTX noted and no UC palpated. VSS.

## 2019-01-27 NOTE — Discharge Summary (Signed)
Patient's last menstrual period was 05/08/2018. EDC: Estimated Date of Delivery: 02/12/19 EGA: [redacted]w[redacted]d  Patient presented for evaluation of labor.  Patient had cervical exam by RN and this was reported to me. I reviewed her vital signs and fetal tracing, both of which were reassuring.  Patient was discharged as she was not laboring.  NST interpretation: Reactive.  Ranae Plumber, MD Attending Obstetrician and Gynecologist [facility]

## 2019-01-28 NOTE — Discharge Summary (Signed)
No change in cervix, fetal tracing Cat I strip

## 2019-02-22 ENCOUNTER — Inpatient Hospital Stay: Payer: Medicaid Other | Admitting: Anesthesiology

## 2019-02-22 ENCOUNTER — Other Ambulatory Visit: Payer: Self-pay

## 2019-02-22 ENCOUNTER — Inpatient Hospital Stay
Admission: EM | Admit: 2019-02-22 | Discharge: 2019-02-24 | DRG: 806 | Disposition: A | Discharge status: Home / Self Care | Payer: Medicaid Other | Attending: Obstetrics & Gynecology | Admitting: Obstetrics & Gynecology

## 2019-02-22 DIAGNOSIS — Z3A41 41 weeks gestation of pregnancy: Secondary | ICD-10-CM

## 2019-02-22 DIAGNOSIS — D62 Acute posthemorrhagic anemia: Secondary | ICD-10-CM | POA: Diagnosis not present

## 2019-02-22 DIAGNOSIS — O9081 Anemia of the puerperium: Secondary | ICD-10-CM | POA: Diagnosis not present

## 2019-02-22 DIAGNOSIS — O480 Post-term pregnancy: Principal | ICD-10-CM | POA: Diagnosis present

## 2019-02-22 DIAGNOSIS — Z23 Encounter for immunization: Secondary | ICD-10-CM

## 2019-02-22 DIAGNOSIS — O34219 Maternal care for unspecified type scar from previous cesarean delivery: Secondary | ICD-10-CM | POA: Diagnosis present

## 2019-02-22 LAB — CBC
HCT: 31.7 % — ABNORMAL LOW (ref 36.0–46.0)
Hemoglobin: 9.8 g/dL — ABNORMAL LOW (ref 12.0–15.0)
MCH: 23.6 pg — ABNORMAL LOW (ref 26.0–34.0)
MCHC: 30.9 g/dL (ref 30.0–36.0)
MCV: 76.2 fL — ABNORMAL LOW (ref 80.0–100.0)
Platelets: 251 10*3/uL (ref 150–400)
RBC: 4.16 MIL/uL (ref 3.87–5.11)
RDW: 19.5 % — AB (ref 11.5–15.5)
WBC: 8.5 10*3/uL (ref 4.0–10.5)
nRBC: 0 % (ref 0.0–0.2)

## 2019-02-22 LAB — OB RESULTS CONSOLE VARICELLA ZOSTER ANTIBODY, IGG: Varicella: NON-IMMUNE/NOT IMMUNE

## 2019-02-22 LAB — OB RESULTS CONSOLE HEPATITIS B SURFACE ANTIGEN: Hepatitis B Surface Ag: NEGATIVE

## 2019-02-22 LAB — OB RESULTS CONSOLE HIV ANTIBODY (ROUTINE TESTING): HIV: NONREACTIVE

## 2019-02-22 LAB — OB RESULTS CONSOLE GBS: GBS: POSITIVE

## 2019-02-22 LAB — TYPE AND SCREEN
ABO/RH(D): O POS
Antibody Screen: NEGATIVE

## 2019-02-22 LAB — OB RESULTS CONSOLE RUBELLA ANTIBODY, IGM: Rubella: IMMUNE

## 2019-02-22 LAB — GROUP B STREP BY PCR: Group B strep by PCR: POSITIVE — AB

## 2019-02-22 LAB — OB RESULTS CONSOLE RPR: RPR: NONREACTIVE

## 2019-02-22 MED ORDER — ONDANSETRON HCL 4 MG PO TABS: 4.0000 mg | ORAL_TABLET | ORAL | Status: DC | PRN

## 2019-02-22 MED ORDER — ACETAMINOPHEN 500 MG PO TABS: 1000.0000 mg | ORAL_TABLET | Freq: Four times a day (QID) | ORAL | Status: DC | PRN

## 2019-02-22 MED ORDER — SIMETHICONE 80 MG PO CHEW: 160.0000 mg | CHEWABLE_TABLET | ORAL | Status: DC | PRN

## 2019-02-22 MED ORDER — AMMONIA AROMATIC IN INHA
RESPIRATORY_TRACT | Status: AC
  Filled 2019-02-22: qty 10

## 2019-02-22 MED ORDER — OXYTOCIN 40 UNITS IN NORMAL SALINE INFUSION - SIMPLE MED
2.5000 [IU]/h | INTRAVENOUS | Status: DC
  Administered 2019-02-22: 2.5 [IU]/h via INTRAVENOUS
  Filled 2019-02-22: qty 1000

## 2019-02-22 MED ORDER — MEDROXYPROGESTERONE ACETATE 150 MG/ML IM SUSP
150.0000 mg | INTRAMUSCULAR | Status: AC | PRN
  Administered 2019-02-24: 150 mg via INTRAMUSCULAR
  Filled 2019-02-22: qty 1

## 2019-02-22 MED ORDER — OXYTOCIN BOLUS FROM INFUSION
500.0000 mL | Freq: Once | INTRAVENOUS | Status: AC
  Administered 2019-02-22: 500 mL via INTRAVENOUS

## 2019-02-22 MED ORDER — LACTATED RINGERS IV SOLN: 500.0000 mL | Freq: Once | INTRAVENOUS | Status: DC

## 2019-02-22 MED ORDER — METHYLERGONOVINE MALEATE 0.2 MG/ML IJ SOLN
INTRAMUSCULAR | Status: AC
  Administered 2019-02-22: 0.2 mg
  Filled 2019-02-22: qty 1

## 2019-02-22 MED ORDER — IBUPROFEN 600 MG PO TABS
600.0000 mg | ORAL_TABLET | Freq: Four times a day (QID) | ORAL | Status: DC
  Administered 2019-02-22 – 2019-02-24 (×9): 600 mg via ORAL
  Filled 2019-02-22 (×9): qty 1

## 2019-02-22 MED ORDER — LIDOCAINE-EPINEPHRINE (PF) 1.5 %-1:200000 IJ SOLN
INTRAMUSCULAR | Status: DC | PRN
  Administered 2019-02-22 (×2): 4 mL via PERINEURAL

## 2019-02-22 MED ORDER — SODIUM CHLORIDE 0.9 % IV SOLN
INTRAVENOUS | Status: AC
  Administered 2019-02-22: 2 g via INTRAVENOUS
  Filled 2019-02-22: qty 2000

## 2019-02-22 MED ORDER — LIDOCAINE HCL (PF) 1 % IJ SOLN
INTRAMUSCULAR | Status: DC | PRN
  Administered 2019-02-22: 3 mL via INTRADERMAL

## 2019-02-22 MED ORDER — PHENYLEPHRINE 40 MCG/ML (10ML) SYRINGE FOR IV PUSH (FOR BLOOD PRESSURE SUPPORT): 80.0000 ug | PREFILLED_SYRINGE | INTRAVENOUS | Status: DC | PRN

## 2019-02-22 MED ORDER — EPHEDRINE 5 MG/ML INJ: 10.0000 mg | INTRAVENOUS | Status: DC | PRN

## 2019-02-22 MED ORDER — PRENATAL MULTIVITAMIN CH
1.0000 | ORAL_TABLET | Freq: Every day | ORAL | Status: DC
  Administered 2019-02-22 – 2019-02-24 (×3): 1 via ORAL
  Filled 2019-02-22 (×3): qty 1

## 2019-02-22 MED ORDER — LACTATED RINGERS IV SOLN: 500.0000 mL | INTRAVENOUS | Status: DC | PRN

## 2019-02-22 MED ORDER — FENTANYL-BUPIVACAINE-NACL 0.5-0.125-0.9 MG/250ML-% EP SOLN: 12.0000 mL/h | EPIDURAL | Status: DC | PRN

## 2019-02-22 MED ORDER — INFLUENZA VAC SPLIT HIGH-DOSE 0.5 ML IM SUSY
0.5000 mL | PREFILLED_SYRINGE | INTRAMUSCULAR | Status: DC
  Filled 2019-02-22: qty 0.5

## 2019-02-22 MED ORDER — WITCH HAZEL-GLYCERIN EX PADS: 1.0000 "application " | MEDICATED_PAD | CUTANEOUS | Status: DC | PRN

## 2019-02-22 MED ORDER — BUPIVACAINE HCL (PF) 0.25 % IJ SOLN
INTRAMUSCULAR | Status: DC | PRN
  Administered 2019-02-22: 4 mL via EPIDURAL
  Administered 2019-02-22: 5 mL via EPIDURAL

## 2019-02-22 MED ORDER — SENNOSIDES-DOCUSATE SODIUM 8.6-50 MG PO TABS
2.0000 | ORAL_TABLET | ORAL | Status: DC
  Administered 2019-02-22 – 2019-02-23 (×2): 2 via ORAL
  Filled 2019-02-22 (×2): qty 2

## 2019-02-22 MED ORDER — ONDANSETRON HCL 4 MG/2ML IJ SOLN: 4.0000 mg | INTRAMUSCULAR | Status: DC | PRN

## 2019-02-22 MED ORDER — FENTANYL 2.5 MCG/ML W/ROPIVACAINE 0.15% IN NS 100 ML EPIDURAL (ARMC)
EPIDURAL | Status: DC | PRN
  Administered 2019-02-22: 12 mL/h via EPIDURAL

## 2019-02-22 MED ORDER — TERBUTALINE SULFATE 1 MG/ML IJ SOLN: 0.2500 mg | Freq: Once | INTRAMUSCULAR | Status: DC | PRN

## 2019-02-22 MED ORDER — OXYTOCIN 10 UNIT/ML IJ SOLN
INTRAMUSCULAR | Status: AC
  Filled 2019-02-22: qty 2

## 2019-02-22 MED ORDER — FERROUS SULFATE 325 (65 FE) MG PO TABS
325.0000 mg | ORAL_TABLET | Freq: Two times a day (BID) | ORAL | Status: DC
  Administered 2019-02-22 – 2019-02-24 (×5): 325 mg via ORAL
  Filled 2019-02-22 (×5): qty 1

## 2019-02-22 MED ORDER — OXYTOCIN 40 UNITS IN NORMAL SALINE INFUSION - SIMPLE MED
1.0000 m[IU]/min | INTRAVENOUS | Status: DC
  Administered 2019-02-22: 2 m[IU]/min via INTRAVENOUS
  Filled 2019-02-22: qty 1000

## 2019-02-22 MED ORDER — DIPHENHYDRAMINE HCL 25 MG PO CAPS: 25.0000 mg | ORAL_CAPSULE | Freq: Four times a day (QID) | ORAL | Status: DC | PRN

## 2019-02-22 MED ORDER — ONDANSETRON HCL 4 MG/2ML IJ SOLN
4.0000 mg | Freq: Four times a day (QID) | INTRAMUSCULAR | Status: DC | PRN
  Administered 2019-02-22: 4 mg via INTRAVENOUS
  Filled 2019-02-22: qty 2

## 2019-02-22 MED ORDER — SOD CITRATE-CITRIC ACID 500-334 MG/5ML PO SOLN: 30.0000 mL | ORAL | Status: DC | PRN

## 2019-02-22 MED ORDER — LIDOCAINE HCL (PF) 1 % IJ SOLN
INTRAMUSCULAR | Status: AC
  Filled 2019-02-22: qty 30

## 2019-02-22 MED ORDER — COCONUT OIL OIL: 1.0000 "application " | TOPICAL_OIL | Status: DC | PRN

## 2019-02-22 MED ORDER — LIDOCAINE HCL (PF) 1 % IJ SOLN: 30.0000 mL | INTRAMUSCULAR | Status: DC | PRN

## 2019-02-22 MED ORDER — VITAMIN C 500 MG PO TABS
500.0000 mg | ORAL_TABLET | Freq: Two times a day (BID) | ORAL | Status: DC
  Administered 2019-02-22 – 2019-02-24 (×5): 500 mg via ORAL
  Filled 2019-02-22 (×7): qty 1

## 2019-02-22 MED ORDER — SODIUM CHLORIDE 0.9 % IV SOLN
1.0000 g | INTRAVENOUS | Status: DC
  Administered 2019-02-22: 1 g via INTRAVENOUS

## 2019-02-22 MED ORDER — BUTORPHANOL TARTRATE 2 MG/ML IJ SOLN: 1.0000 mg | INTRAMUSCULAR | Status: DC | PRN

## 2019-02-22 MED ORDER — CARBOPROST TROMETHAMINE 250 MCG/ML IM SOLN
INTRAMUSCULAR | Status: AC
  Filled 2019-02-22: qty 1

## 2019-02-22 MED ORDER — LACTATED RINGERS IV SOLN
INTRAVENOUS | Status: DC
  Administered 2019-02-22: 03:00:00 via INTRAVENOUS

## 2019-02-22 MED ORDER — BENZOCAINE-MENTHOL 20-0.5 % EX AERO: 1.0000 "application " | INHALATION_SPRAY | CUTANEOUS | Status: DC | PRN

## 2019-02-22 MED ORDER — DIPHENHYDRAMINE HCL 50 MG/ML IJ SOLN: 12.5000 mg | INTRAMUSCULAR | Status: DC | PRN

## 2019-02-22 MED ORDER — DIBUCAINE 1 % RE OINT: 1.0000 "application " | TOPICAL_OINTMENT | RECTAL | Status: DC | PRN

## 2019-02-22 MED ORDER — FENTANYL 2.5 MCG/ML W/ROPIVACAINE 0.15% IN NS 100 ML EPIDURAL (ARMC)
EPIDURAL | Status: AC
  Filled 2019-02-22: qty 100

## 2019-02-22 MED ORDER — SODIUM CHLORIDE 0.9 % IV SOLN
2.0000 g | INTRAVENOUS | Status: DC
  Administered 2019-02-22: 2 g via INTRAVENOUS

## 2019-02-22 MED ORDER — SODIUM CHLORIDE 0.9 % IV SOLN
INTRAVENOUS | Status: AC
  Filled 2019-02-22: qty 1000

## 2019-02-22 MED ORDER — TETANUS-DIPHTH-ACELL PERTUSSIS 5-2.5-18.5 LF-MCG/0.5 IM SUSP: 0.5000 mL | Freq: Once | INTRAMUSCULAR | Status: DC

## 2019-02-22 MED ORDER — ACETAMINOPHEN 325 MG PO TABS
650.0000 mg | ORAL_TABLET | ORAL | Status: DC | PRN
  Administered 2019-02-22 – 2019-02-23 (×3): 650 mg via ORAL
  Filled 2019-02-22 (×3): qty 2

## 2019-02-22 MED ORDER — METHYLERGONOVINE MALEATE 0.2 MG/ML IJ SOLN: 0.2000 mg | Freq: Once | INTRAMUSCULAR | Status: AC

## 2019-02-22 MED ORDER — MISOPROSTOL 200 MCG PO TABS
ORAL_TABLET | ORAL | Status: AC
  Filled 2019-02-22: qty 4

## 2019-02-22 NOTE — H&P (Addendum)
OB History & Physical   History of Present Illness:  Chief Complaint:   HPI:  Maria Gates is a 33 y.o. [id number] female at [redacted]w[redacted]d dated by sure LMP Patient's last menstrual period was 05/08/2018. Estimated Date of Delivery: 02/12/19  She presents to L&D with pelvic pressure.  No prenatal care this pregnancy.  Had ultrasound done at 26 weeks that was consistent with dates.  Was seen here during this pregnancy by me at 35 weeks for contractions and cervical dilation, found to have Hb of 8.8.  s/p betamethasone, terbutaline, and transfused 2 units of PRBCs.    +FM, + occasional CTX, no LOF, no VB Denies pain between contractions/cramping.  Pregnancy Issues: 1. No prenatal care 2. History of cesarean for face presentation at full dilatation, no VBAC.  4 FT SVD prior. 3. History of macrosomia infant (9lb 2oz), pelvis tested to 8lb 10oz 4. Anemia s/p blood transfusion at 35wks 5. Past due date, desires TOLAC   Maternal Medical History:   Past Medical History:  Diagnosis Date  . Medical history non-contributory   History of chlamydia in prior pregnancy  Past Surgical History:  Procedure Laterality Date  . CESAREAN SECTION N/A 01/02/2017   Procedure: CESAREAN SECTION;  Surgeon: Linzie Collin, MD;  Location: [facility] ORS;  Service: Obstetrics;  Laterality: N/A;   No meds No Known Allergies   Prenatal care site: none  Social History: She  reports that she has never smoked. She has never used smokeless tobacco. She reports that she does not drink alcohol or use drugs.  Family History: family history includes Hypertension in her maternal grandmother.   Review of Systems: A full review of systems was performed and negative except as noted in the HPI.     Physical Exam:  Vital Signs: BP 126/80   Pulse 67   Temp 98 F (36.7 C) (Oral)   Resp 18   Ht 5\' 4"  (1.626 m)   LMP 05/08/2018   BMI 26.11 kg/m  General: no acute distress.  HEENT: normocephalic, atraumatic Heart: regular  rate & rhythm.  No murmurs/rubs/gallops Lungs: clear to auscultation bilaterally, normal respiratory effort Abdomen: soft, gravid, non-tender;  EFW: 8lb  Pelvic:   External: Normal external female genitalia  Cervix: Dilation: 6 / Effacement (%): 70 / Station: -2    Extremities: non-tender, symmetric, no edema bilaterally.  DTRs: 2+ Neurologic: Alert & oriented x 3.     Pertinent Results:  Prenatal Labs: Blood type/Rh O+  Antibody screen negative  Rubella Immune (2018)  Varicella Non-Immune (2018)  RPR Non-reactive  HBsAg negative  HIV Non-reactive  GC negative  Chlamydia negative  Genetic screening Not done  1 hour GTT Not done  3 hour GTT   GBS Negative @ 35wks - repeat pending   FHT: 115 mod + accels no decels TOCO:  occasional  SVE:  Dilation: 6 / Effacement (%): 70 / Station: -2    Cephalic by leopolds, and on SVE, with AROM.   Assessment:  Maria Gates is a 33 y.o. G21P1015 female at [redacted]w[redacted]d with past due date, advanced dilation and desire to [facility].   Plan:  1. Admit to Labor & Delivery 2. CBC, T&S, Clrs, IVF 3. GBS PCR collected  4. TOLAC protocol enacted, physician and anesthesia will be on-site and OR is available. 5. Continuous efm/toco 6. Category 1 tracing 7. AROM, pitocin.   ----- Ranae Plumber, MD Attending Obstetrician and Gynecologist [facility], Department of OB/GYN [facility]

## 2019-02-22 NOTE — Anesthesia Procedure Notes (Signed)
Epidural Patient location during procedure: OB  Staffing Performed: anesthesiologist   Preanesthetic Checklist Completed: patient identified, site marked, surgical consent, pre-op evaluation, timeout performed, IV checked, risks and benefits discussed and monitors and equipment checked  Epidural Patient position: sitting Prep: Betadine Patient monitoring: heart rate, continuous pulse ox and blood pressure Approach: midline Location: L3-L4 Injection technique: LOR saline  Needle:  Needle type: Tuohy  Needle gauge: 17 G Needle length: 9 cm and 9 Needle insertion depth: 5 cm Catheter type: closed end flexible Catheter size: 19 Gauge Catheter at skin depth: 11 cm Test dose: negative and 1.5% lidocaine with Epi 1:200 K  Assessment Sensory level: T10 Events: blood not aspirated, injection not painful, no injection resistance, negative IV test and no paresthesia  Additional Notes   Patient tolerated the insertion well without complications.-SATD -IVTD. No paresthesia. Refer to [facility] nursing for VS and dosingReason for block:procedure for pain

## 2019-02-22 NOTE — Anesthesia Preprocedure Evaluation (Signed)
Anesthesia Evaluation  Patient identified by MRN, date of birth, ID band Patient awake    Reviewed: Allergy & Precautions, H&P , NPO status , Patient's Chart, lab work & pertinent test results, reviewed documented beta blocker date and time   Airway Mallampati: II  TM Distance: >3 FB Neck ROM: full    Dental no notable dental hx. (+) Teeth Intact   Pulmonary neg pulmonary ROS, Current Smoker,    Pulmonary exam normal breath sounds clear to auscultation       Cardiovascular Exercise Tolerance: Good negative cardio ROS   Rhythm:regular Rate:Normal     Neuro/Psych negative neurological ROS  negative psych ROS   GI/Hepatic negative GI ROS, Neg liver ROS,   Endo/Other  negative endocrine ROSdiabetes, Gestational  Renal/GU      Musculoskeletal   Abdominal   Peds  Hematology  (+) Blood dyscrasia, anemia ,   Anesthesia Other Findings   Reproductive/Obstetrics (+) Pregnancy                             Anesthesia Physical Anesthesia Plan  ASA: II  Anesthesia Plan: Epidural   Post-op Pain Management:    Induction:   PONV Risk Score and Plan:   Airway Management Planned:   Additional Equipment:   Intra-op Plan:   Post-operative Plan:   Informed Consent: I have reviewed the patients History and Physical, chart, labs and discussed the procedure including the risks, benefits and alternatives for the proposed anesthesia with the patient or authorized representative who has indicated his/her understanding and acceptance.       Plan Discussed with:   Anesthesia Plan Comments:         Anesthesia Quick Evaluation  

## 2019-02-22 NOTE — Progress Notes (Signed)
Labor Progress Note  Maria Gates is a 33 y.o. [id number] at [redacted]w[redacted]d by LMP consistent with 26 week ultrasound admitted for induction of labor due to advanced cervical dilation and late term pregnancy.  Subjective:  Patient feeling pressure with contractions.   Objective: BP 105/65 (BP Location: Right Arm)   Pulse 67   Temp 98.4 F (36.9 C) (Oral)   Resp 16   Ht 5\' 4"  (1.626 m)   LMP 05/08/2018   SpO2 98%   BMI 26.11 kg/m   Fetal Assessment: FHT:  FHR: 125 bpm, variability: moderate,  accelerations:  Present,  decelerations:  Present variable Category/reactivity:  Category II UC:   regular, every 2 minutes SVE: per RN Maria Gates at [phone] Dilation: 8.5cm  Effacement: 90%  Station:  -1  Consistency: soft  Membrane status: AROM 0318 Amniotic color: clear  Labs: Lab Results  Component Value Date   WBC 8.5 02/22/2019   HGB 9.8 (L) 02/22/2019   HCT 31.7 (L) 02/22/2019   MCV 76.2 (L) 02/22/2019   PLT 251 02/22/2019    Assessment / Plan: Induction of labor due to late term pregnancy,  progressing well on pitocin  Labor: Pitocin currently infusing at 58mu/min, contracting regularly Fetal Wellbeing:  Category II for rare variable decel, overall reassuring  Pain Control:  Epidural Anticipated MOD:  NSVD  Genia Del, CNM 02/22/2019, 8:23 AM

## 2019-02-22 NOTE — Progress Notes (Signed)
Pt requests epidural.  0455 Dr Pernell Dupre notified 504 MD at bs [phone] test dose given Maternal hr 85 IV bolus started 0516 bolus 1 given 0522 bolus 2 given 0533 Epidural infusion started

## 2019-02-22 NOTE — Progress Notes (Signed)
Fentanyl from epidural stopped at 0945.

## 2019-02-22 NOTE — Discharge Instructions (Signed)

## 2019-02-22 NOTE — OB Triage Note (Signed)
Pt presents to L&D with c/o contractions since Sunday, worsening today. Pt denies LOF and Bleeding and reports good fetal movement. Pty has has no PNC except for when she was seen by Dr Elesa Massed last month in L&D. EFM and toco applied and explained. Pt has h/o previous c/s for face presentation.

## 2019-02-22 NOTE — Discharge Summary (Signed)
Obstetric Discharge Summary   Patient Name: Maria Gates DOB: 1986/08/21 MRN: [id number]  Date of Admission: 02/22/2019 Date of Delivery: 02/22/2019 Delivered by: Genia Del, CNM Date of Discharge: 02/24/2019  Primary OB: none KWI:[id number]'D last menstrual period was 05/08/2018. EDC Estimated Date of Delivery: 02/12/19 Gestational Age at Delivery: [redacted]w[redacted]d   Antepartum complications:  Pregnancy Issues: 1. No prenatal care 2. History of cesarean for face presentation at full dilatation, no VBAC.  4 FT SVD prior. 3. History of macrosomia infant (9lb 2oz), pelvis tested to 8lb 10oz 4. Anemia s/p blood transfusion at 35wks 5. Past due date, desires TOLAC  Admitting Diagnosis: advanced cervical dilation, late term pregnancy  Secondary Diagnoses: Patient Active Problem List   Diagnosis Date Noted  . Labor and delivery indication for care or intervention 02/22/2019  . Indication for care/intervention related to labor/delivery, antepartum 01/20/2019  . Preterm labor 01/11/2019  . History of cesarean delivery, currently pregnant 01/11/2019  . Microcytic anemia 01/11/2019  . Labor and delivery, indication for care 10/23/2015  . Insufficient prenatal care 10/23/2015    Induction/Augmentation: AROM and Pitocin Complications: None Intrapartum complications/course: Jabier Mutton presented to L&D with pelvic pressure and advanced cervical dilation at late term. She was induced with AROM and pitocin. Epidural placed for pain relief. She progressed to complete and had a spontaneous vaginal birth of a live female over an intact perineum. The fetal head was delivered in direct OA position with restitution to ROA. No nuchal cord. Anterior then posterior shoulders delivered spontaneously. Baby placed on mom's abdomen and attended to by transition RN. Cord clamped and cut after 2 minute delay by myself. Cord blood obtained for newborn labs. Placenta delivered spontaneously intact with 3VC. IV  pitocin and IM methergine given for hemorrhage prophylaxis. Left labial laceration identified and repaired with two interrupted stiches for hemostasis.   Delivery Type: vaginal birth after cesarean (VBAC) Anesthesia: epidural Placenta: spontaneous Laceration: left labial Episiotomy: none  Newborn Data: Live born female  Birth Weight: 7#5 APGAR: 77, 9  Newborn Delivery   Birth date/time:  02/22/2019 09:24:00 Delivery type:  VBAC, Spontaneous     Postpartum Course  Patient had an uncomplicated postpartum course.  By time of discharge on PPD#2, her pain was controlled on oral pain medications; she had appropriate lochia and was ambulating, voiding without difficulty and tolerating regular diet.  She was deemed stable for discharge to home.       Labs: CBC Latest Ref Rng & Units 02/23/2019 02/22/2019 01/12/2019  WBC 4.0 - 10.5 K/uL 11.3(H) 8.5 -  Hemoglobin 12.0 - 15.0 g/dL 10.0(L) 9.8(L) 9.5(L)  Hematocrit 36.0 - 46.0 % 32.5(L) 31.7(L) 30.5(L)  Platelets 150 - 400 K/uL 235 251 -   O POS  Physical exam:  BP 112/72 (BP Location: Right Arm)   Pulse 60   Temp 98.4 F (36.9 C) (Oral)   Resp 18   Ht 5\' 4"  (1.626 m)   Wt 69 kg   LMP 05/08/2018   SpO2 100%   Breastfeeding Unknown   BMI 26.11 kg/m  General: alert and no distress Pulm: normal respiratory effort Lochia: appropriate Abdomen: soft, NT Uterine Fundus: firm, below umbilicus Extremities: No evidence of DVT seen on physical exam. No lower extremity edema.   Disposition: stable, discharge to home Baby Feeding: breastmilk & formula Baby Disposition: home with mom  Contraception: Depo prior to DC, plan Nexplanon via [facility]  Prenatal Labs:  Blood type/Rh O+  Antibody screen negative  Rubella Immune   Varicella Non-Immune (  2018), non-immune (2020)  RPR Non-reactive  HBsAg negative  HIV Non-reactive  GC negative  Chlamydia negative  Genetic screening Not done  1 hour GTT Not done  3 hour GTT n/a  GBS positive     Rh Immune globulin given: n/a Rubella vaccine given: n/a Varicella vaccine given: 02/24/19 prior to DC Tdap vaccine given in AP or PP setting: has received adult booster, not indicated.  Flu vaccine given in AP or PP setting: ordered PP  Plan:  Tallis Durban was discharged to home in good condition. [facility]-up appointment at [facility] with delivery provider in 6 weeks  Discharge Instructions: Per After Visit Summary. Activity: Advance as tolerated. Pelvic rest for 6 weeks.   Diet: Regular Discharge Medications: Allergies as of 02/24/2019   No Known Allergies     Medication List    STOP taking these medications   Fusion Plus Caps   oxyCODONE-acetaminophen 5-325 MG tablet Commonly known as:  PERCOCET/ROXICET     TAKE these medications   acetaminophen 325 MG tablet Commonly known as:  Tylenol Take 2 tablets (650 mg total) by mouth every 4 (four) hours as needed (for pain scale < 4).   ascorbic acid 500 MG tablet Commonly known as:  VITAMIN C Take 1 tablet (500 mg total) by mouth 2 (two) times daily with a meal.   ferrous sulfate 325 (65 FE) MG tablet Take 1 tablet (325 mg total) by mouth 2 (two) times daily with a meal.   ibuprofen 600 MG tablet Commonly known as:  ADVIL,MOTRIN Take 1 tablet (600 mg total) by mouth every 6 (six) hours. What changed:    when to take this  reasons to take this  Another medication with the same name was removed. Continue taking this medication, and follow the directions you see here.   prenatal multivitamin Tabs tablet Take 1 tablet by mouth daily at 12 noon.   senna-docusate 8.6-50 MG tablet Commonly known as:  Senokot-S Take 2 tablets by mouth daily. Start taking on:  February 25, 2019      Outpatient follow up:  Follow-up Information    Genia Del, CNM. Schedule an appointment as soon as possible for a visit in 6 week(s).   Specialty:  Certified Nurse Midwife Why:  For routine postpartum visit Contact  information: [address] [place] [address] [phone]        Department, [facility] Follow up in 6 week(s).   Why:  call for nexplanon and postpartum appt.  Contact information: [address] [place] [address] [phone]            Signed: Randa Ngo, CNM 02/24/2019 8:54 AM

## 2019-02-23 LAB — CBC
HCT: 32.5 % — ABNORMAL LOW (ref 36.0–46.0)
HEMOGLOBIN: 10 g/dL — AB (ref 12.0–15.0)
MCH: 23.4 pg — ABNORMAL LOW (ref 26.0–34.0)
MCHC: 30.8 g/dL (ref 30.0–36.0)
MCV: 76.1 fL — ABNORMAL LOW (ref 80.0–100.0)
Platelets: 235 10*3/uL (ref 150–400)
RBC: 4.27 MIL/uL (ref 3.87–5.11)
RDW: 19.8 % — ABNORMAL HIGH (ref 11.5–15.5)
WBC: 11.3 10*3/uL — ABNORMAL HIGH (ref 4.0–10.5)
nRBC: 0 % (ref 0.0–0.2)

## 2019-02-23 LAB — RPR: RPR Ser Ql: NONREACTIVE

## 2019-02-23 NOTE — Progress Notes (Addendum)
Post Partum Day 1 Subjective: no complaints, up ad lib and voiding  Objective: Blood pressure 104/70, pulse 63, temperature 98.2 F (36.8 C), temperature source Oral, resp. rate 18, height 5\' 4"  (1.626 m), weight 69 kg, last menstrual period 05/08/2018, SpO2 97 %, unknown if currently breastfeeding.  Physical Exam:  General: alert and cooperative Lochia: appropriate Uterine Fundus: firm DVT Evaluation: No evidence of DVT seen on physical exam.  Recent Labs    02/22/19 0345 02/23/19 0405  HGB 9.8* 10.0*  HCT 31.7* 32.5*    Assessment/Plan: Plan for discharge tomorrow and Breastfeeding   - No prenatal care: Will need assistance with car seat, ride home possibly. -Acute blood loss anemia - hemodynamically stable and asymptomatic; start PO ferrous sulfate BID with stool softeners and ascorbic acid.     LOS: 1 day   Christeen Douglas 02/23/2019, 9:14 AM

## 2019-02-23 NOTE — Anesthesia Postprocedure Evaluation (Signed)
Anesthesia Post Note  Patient: Maria Gates  Procedure(s) Performed: AN AD HOC LABOR EPIDURAL  Patient location during evaluation: Mother Baby Anesthesia Type: Epidural Level of consciousness: awake, awake and alert and oriented Pain management: pain level controlled Vital Signs Assessment: vitals unstable and post-procedure vital signs reviewed and stable Respiratory status: spontaneous breathing, nonlabored ventilation and respiratory function stable Cardiovascular status: blood pressure returned to baseline and stable Postop Assessment: no headache Anesthetic complications: no Comments: C/O backache.  Looks like 3 small needle pricks, not red or erythematous, no fever.  VSS.  Told the patient it was likely sore from several attempts, but not infected. Assured that it would heal like a bruise over a couple of days.     Last Vitals:  Vitals:   02/22/19 2322 02/23/19 0723  BP: 103/78 104/70  Pulse: (!) 59 63  Resp: 18 18  Temp: 36.9 C 36.8 C  SpO2: 99% 97%    Last Pain:  Vitals:   02/23/19 0730  TempSrc:   PainSc: 7                  Vernie Murders

## 2019-02-24 LAB — VARICELLA ZOSTER ANTIBODY, IGG: Varicella IgG: <135 index — ABNORMAL LOW (ref >165)

## 2019-02-24 MED ORDER — ASCORBIC ACID 500 MG PO TABS: 500.0000 mg | ORAL_TABLET | Freq: Two times a day (BID) | ORAL | 0 refills | Status: DC

## 2019-02-24 MED ORDER — ACETAMINOPHEN 325 MG PO TABS: 650.0000 mg | ORAL_TABLET | ORAL | 0 refills | Status: DC | PRN

## 2019-02-24 MED ORDER — IBUPROFEN 600 MG PO TABS: 600.0000 mg | ORAL_TABLET | Freq: Four times a day (QID) | ORAL | 0 refills | Status: DC

## 2019-02-24 MED ORDER — VARICELLA VIRUS VACCINE LIVE 1350 PFU/0.5ML IJ SUSR
0.5000 mL | Freq: Once | INTRAMUSCULAR | Status: AC
  Administered 2019-02-24: 0.5 mL via SUBCUTANEOUS
  Filled 2019-02-24 (×2): qty 0.5

## 2019-02-24 MED ORDER — INFLUENZA VAC SPLIT QUAD 0.5 ML IM SUSY
0.5000 mL | PREFILLED_SYRINGE | Freq: Once | INTRAMUSCULAR | Status: AC
  Administered 2019-02-24: 0.5 mL via INTRAMUSCULAR
  Filled 2019-02-24: qty 0.5

## 2019-02-24 MED ORDER — SENNOSIDES-DOCUSATE SODIUM 8.6-50 MG PO TABS: 2.0000 | ORAL_TABLET | ORAL | 0 refills | Status: DC

## 2019-02-24 NOTE — Progress Notes (Signed)
Discharge order received from doctor. Depo, flu vaccine and varicella vaccine given prior to discharge. Reviewed discharge instructions and prescriptions with patient and answered all questions. Follow up appointment instructions given. Patient verbalized understanding. ID bands checked. Patient discharged home with infant via wheelchair by nursing/auxillary.    Oswald Hillock, RN

## 2019-02-24 NOTE — Clinical Social Work Maternal (Signed)
  CLINICAL SOCIAL WORK MATERNAL/CHILD NOTE  Patient Details  Name: Maria Gates MRN: [id number] Date of Birth: Dec 17, 1985  Date:  02/24/2019  Clinical Social Worker Initiating Note:  Shela Leff MSW,LCSW Date/Time: Initiated:  02/24/19/      Child's Name:      Biological Parents:  Mother   Need for Interpreter:  None   Reason for Referral:  Late or No Prenatal Care    Address:  [place] [address]    Phone number:  [phone] (home)     Additional phone number: none  Household Members/Support Persons (HM/SP):       HM/SP Name Relationship DOB or Age  HM/SP -1        HM/SP -2        HM/SP -3        HM/SP -4        HM/SP -5        HM/SP -6        HM/SP -7        HM/SP -8          Natural Supports (not living in the home):  Friends, Extended Family   Professional Supports: None   Employment: Unemployed   Type of Work:     Education:      Homebound arranged:    Museum/gallery curator Resources:  Self-Pay    Other Resources:  Physicist, medical , [place] Considerations Which May Impact Care:  none  Strengths:  Ability to meet basic needs , Home prepared for child    Psychotropic Medications:         Pediatrician:       Pediatrician List:   [place]      Pediatrician Fax Number:    Risk Factors/Current Problems:  None   Cognitive State:  Alert , Able to Concentrate    Mood/Affect:  Calm , Comfortable    CSW Assessment: CSW met with patient and explained role and purpose of visit. This is patient's 6th child and the other children are all in the home with her. She lives with her mother and her sister assists as well. Patient states that she does not work and that she did not get prenatal care initially because she did not have transportation. Patient earlier in our conversation stated she utilizes her sister for  transportation, When CSW noted this, patient then admitted that she had not wanted to have this baby but her mother would not let her "do anything about it." Patient stated she already has 5 children and did not feel as though she needed to go for prenatal care. Patient reports not having postpartum depression with her other children but states she knows the signs and symptoms to watch for. She states she has all necessities for her newborn and has no concerns at this time.   CSW Plan/Description:  No Further Intervention Required/No Barriers to Discharge    Shela Leff, LCSW 02/24/2019, 12:11 PM

## 2019-02-24 NOTE — Progress Notes (Signed)
Post Partum Day 2 Subjective: Doing well, no complaints.  Tolerating regular diet, pain with PO meds, voiding and ambulating without difficulty.  No CP SOB Fever,Chills, N/V or leg pain; denies nipple or breast pain, no HA change of vision, RUQ/epigastric pain  Objective: BP 112/72 (BP Location: Right Arm)   Pulse 60   Temp 98.4 F (36.9 C) (Oral)   Resp 18   Ht 5\' 4"  (1.626 m)   Wt 69 kg   LMP 05/08/2018   SpO2 100%   Breastfeeding Unknown   BMI 26.11 kg/m    Physical Exam:  General: NAD Breasts: soft/nontender CV: RRR Pulm: nl effort, CTABL Abdomen: soft, NT, BS x 4 Perineum: minimal edema, labial laceration repair well approximated Lochia: small Uterine Fundus: fundus firm and 1 fb below umbilicus DVT Evaluation: no cords, ttp LEs   Recent Labs    02/22/19 0345 02/23/19 0405  HGB 9.8* 10.0*  HCT 31.7* 32.5*  WBC 8.5 11.3*  PLT 251 235    Assessment/Plan: 33 y.o. [id number] postpartum day # 2  - Continue routine PP care - Lactation consult prn.  - Discussed contraceptive options including implant, IUDs hormonal and non-hormonal, injection, pills/ring/patch, condoms, and NFP. Pt plans nexplanon via [facility], but would like Depo prior to DC.  - Acute blood loss anemia - hemodynamically stable and asymptomatic; continue po ferrous sulfate BID with stool softeners  - Immunization status: Needs flu and varicella  prior to DC    Disposition: Does desire Dc home today.     Randa Ngo, CNM 02/24/2019  8:44 AM

## 2020-10-07 ENCOUNTER — Encounter: Payer: Self-pay | Admitting: Obstetrics and Gynecology

## 2020-10-07 ENCOUNTER — Observation Stay
Admission: EM | Admit: 2020-10-07 | Discharge: 2020-10-08 | Disposition: A | Discharge status: Home / Self Care | Payer: Self-pay | Attending: Obstetrics and Gynecology | Admitting: Obstetrics and Gynecology

## 2020-10-07 DIAGNOSIS — R1011 Right upper quadrant pain: Secondary | ICD-10-CM

## 2020-10-07 DIAGNOSIS — O0933 Supervision of pregnancy with insufficient antenatal care, third trimester: Secondary | ICD-10-CM

## 2020-10-07 DIAGNOSIS — O34219 Maternal care for unspecified type scar from previous cesarean delivery: Secondary | ICD-10-CM | POA: Diagnosis present

## 2020-10-07 DIAGNOSIS — R059 Cough, unspecified: Secondary | ICD-10-CM | POA: Insufficient documentation

## 2020-10-07 DIAGNOSIS — O0930 Supervision of pregnancy with insufficient antenatal care, unspecified trimester: Secondary | ICD-10-CM

## 2020-10-07 DIAGNOSIS — Z8759 Personal history of other complications of pregnancy, childbirth and the puerperium: Secondary | ICD-10-CM | POA: Insufficient documentation

## 2020-10-07 DIAGNOSIS — O99513 Diseases of the respiratory system complicating pregnancy, third trimester: Secondary | ICD-10-CM | POA: Insufficient documentation

## 2020-10-07 DIAGNOSIS — O26893 Other specified pregnancy related conditions, third trimester: Principal | ICD-10-CM | POA: Insufficient documentation

## 2020-10-07 DIAGNOSIS — R109 Unspecified abdominal pain: Secondary | ICD-10-CM | POA: Insufficient documentation

## 2020-10-07 DIAGNOSIS — Z3A34 34 weeks gestation of pregnancy: Secondary | ICD-10-CM | POA: Insufficient documentation

## 2020-10-07 DIAGNOSIS — Z20822 Contact with and (suspected) exposure to covid-19: Secondary | ICD-10-CM | POA: Insufficient documentation

## 2020-10-07 DIAGNOSIS — O23593 Infection of other part of genital tract in pregnancy, third trimester: Secondary | ICD-10-CM | POA: Insufficient documentation

## 2020-10-07 HISTORY — DX: Depression, unspecified: F32.A

## 2020-10-07 NOTE — OB Triage Note (Signed)
Pt reports to unit c/o right-sided abdominal pain that started 3 days ago and has progressively gotten worse. Pt also reports having a dry cough and clear LOF that also began 3 days ago. Pt reports having no PNC due to this not being a desired pregnancy. According to LMP, pt is approximately [redacted]w[redacted]d. Pt reports +FM, denies vaginal bleeding and anything in the vagina in the last 24 hours. External monitors applied and initial FHTs 140s. Vital signs WDL.

## 2020-10-08 ENCOUNTER — Encounter: Payer: Self-pay | Admitting: Obstetrics and Gynecology

## 2020-10-08 ENCOUNTER — Observation Stay: Payer: Self-pay

## 2020-10-08 LAB — URINE DRUG SCREEN, QUALITATIVE (ARMC ONLY)
Amphetamines, Ur Screen: NOT DETECTED
Barbiturates, Ur Screen: NOT DETECTED
Benzodiazepine, Ur Scrn: NOT DETECTED
Cannabinoid 50 Ng, Ur ~~LOC~~: NOT DETECTED
Cocaine Metabolite,Ur ~~LOC~~: NOT DETECTED
MDMA (Ecstasy)Ur Screen: NOT DETECTED
Methadone Scn, Ur: NOT DETECTED
Opiate, Ur Screen: NOT DETECTED
Phencyclidine (PCP) Ur S: NOT DETECTED
Tricyclic, Ur Screen: NOT DETECTED

## 2020-10-08 LAB — URINALYSIS, ROUTINE W REFLEX MICROSCOPIC
Bilirubin Urine: NEGATIVE
Glucose, UA: NEGATIVE mg/dL
Hgb urine dipstick: NEGATIVE
Ketones, ur: NEGATIVE mg/dL
Leukocytes,Ua: NEGATIVE
Nitrite: NEGATIVE
Protein, ur: NEGATIVE mg/dL
Specific Gravity, Urine: 1.002 — ABNORMAL LOW (ref 1.005–1.030)
pH: 6 (ref 5.0–8.0)

## 2020-10-08 LAB — CHLAMYDIA/NGC RT PCR (ARMC ONLY)
Chlamydia Tr: NOT DETECTED
N gonorrhoeae: NOT DETECTED

## 2020-10-08 LAB — RUPTURE OF MEMBRANE (ROM)PLUS: Rom Plus: NEGATIVE

## 2020-10-08 LAB — COMPREHENSIVE METABOLIC PANEL
ALT: 11 U/L (ref 0–44)
AST: 23 U/L (ref 15–41)
Albumin: 2.7 g/dL — ABNORMAL LOW (ref 3.5–5.0)
Alkaline Phosphatase: 172 U/L — ABNORMAL HIGH (ref 38–126)
Anion gap: 8 (ref 5–15)
BUN: 6 mg/dL (ref 6–20)
CO2: 22 mmol/L (ref 22–32)
Calcium: 8.2 mg/dL — ABNORMAL LOW (ref 8.9–10.3)
Chloride: 106 mmol/L (ref 98–111)
Creatinine, Ser: 0.43 mg/dL — ABNORMAL LOW (ref 0.44–1.00)
GFR, Estimated: >60 mL/min (ref >60)
Glucose, Bld: 87 mg/dL (ref 70–99)
Potassium: 3.6 mmol/L (ref 3.5–5.1)
Sodium: 136 mmol/L (ref 135–145)
Total Bilirubin: 0.7 mg/dL (ref 0.3–1.2)
Total Protein: 6.8 g/dL (ref 6.5–8.1)

## 2020-10-08 LAB — RESPIRATORY PANEL BY RT PCR (FLU A&B, COVID)
Influenza A by PCR: NEGATIVE
Influenza B by PCR: NEGATIVE
SARS Coronavirus 2 by RT PCR: NEGATIVE

## 2020-10-08 LAB — TYPE AND SCREEN
ABO/RH(D): O POS
Antibody Screen: NEGATIVE

## 2020-10-08 LAB — CBC
HCT: 27.7 % — ABNORMAL LOW (ref 36.0–46.0)
Hemoglobin: 8.6 g/dL — ABNORMAL LOW (ref 12.0–15.0)
MCH: 23.2 pg — ABNORMAL LOW (ref 26.0–34.0)
MCHC: 31 g/dL (ref 30.0–36.0)
MCV: 74.9 fL — ABNORMAL LOW (ref 80.0–100.0)
Platelets: 307 10*3/uL (ref 150–400)
RBC: 3.7 MIL/uL — ABNORMAL LOW (ref 3.87–5.11)
RDW: 15.1 % (ref 11.5–15.5)
WBC: 8.4 10*3/uL (ref 4.0–10.5)
nRBC: 0 % (ref 0.0–0.2)

## 2020-10-08 LAB — HEPATITIS B SURFACE ANTIGEN: Hepatitis B Surface Ag: NONREACTIVE

## 2020-10-08 LAB — WET PREP, GENITAL
Sperm: NONE SEEN
Trich, Wet Prep: NONE SEEN
Yeast Wet Prep HPF POC: NONE SEEN

## 2020-10-08 LAB — HEMOGLOBIN A1C
Hgb A1c MFr Bld: 5.4 % (ref 4.8–5.6)
Mean Plasma Glucose: 108.28 mg/dL

## 2020-10-08 LAB — HIV ANTIBODY (ROUTINE TESTING W REFLEX): HIV Screen 4th Generation wRfx: NONREACTIVE

## 2020-10-08 LAB — RPR: RPR Ser Ql: NONREACTIVE

## 2020-10-08 MED ORDER — SODIUM CHLORIDE 0.9 % IV SOLN
300.0000 mg | Freq: Once | INTRAVENOUS | Status: AC
  Administered 2020-10-08: 300 mg via INTRAVENOUS
  Filled 2020-10-08: qty 15

## 2020-10-08 MED ORDER — LACTATED RINGERS IV SOLN: INTRAVENOUS | Status: DC

## 2020-10-08 NOTE — Discharge Summary (Signed)
Maria Gates is a 34 y.o. female. She is at [redacted]w[redacted]d gestation. Patient's last menstrual period was 02/13/2020 (approximate). Estimated Date of Delivery: 11/19/20  Prenatal care site: No established prenatal care site  Chief complaint: right mid abdominal pain exacerbated with cough   Location: right mid abdomen  Onset/timing: 3 days ago Duration: intermittent  Quality: dull, achy Severity: moderate Aggravating or alleviating conditions: worse with cough Associated signs/symptoms: no nausea, no emesis  Context: Maria Gates presents today with complaints of right sided abd pain that is worse with cough.  She has not established prenatal care yet, estimated to be [redacted] weeks pregnant.   S: Resting comfortably. no CTX, no VB.no LOF,  Active fetal movement.   Maternal Medical History:  Past Medical Hx:  has a past medical history of Depression and Medical history non-contributory.    Past Surgical Hx:  has a past surgical history that includes Cesarean section (N/A, 01/02/2017).   No Known Allergies   Prior to Admission medications   Medication Sig Start Date End Date Taking? Authorizing Provider  acetaminophen (TYLENOL) 325 MG tablet Take 2 tablets (650 mg total) by mouth every 4 (four) hours as needed (for pain scale < 4). 02/24/19  Yes McVey, Prudencio Pair, CNM  ferrous sulfate 325 (65 FE) MG tablet Take 1 tablet (325 mg total) by mouth 2 (two) times daily with a meal. Patient not taking: Reported on 01/11/2019 01/04/17   Linzie Collin, MD  ibuprofen (ADVIL,MOTRIN) 600 MG tablet Take 1 tablet (600 mg total) by mouth every 6 (six) hours. Patient not taking: Reported on 10/07/2020 02/24/19   McVey, Prudencio Pair, CNM  Prenatal Vit-Fe Fumarate-FA (PRENATAL MULTIVITAMIN) TABS tablet Take 1 tablet by mouth daily at 12 noon. Patient not taking: Reported on 10/07/2020    [provider]  senna-docusate (SENOKOT-S) 8.6-50 MG tablet Take 2 tablets by mouth daily. Patient not taking: Reported on  10/07/2020 02/25/19   McVey, Prudencio Pair, CNM  vitamin C (VITAMIN C) 500 MG tablet Take 1 tablet (500 mg total) by mouth 2 (two) times daily with a meal. Patient not taking: Reported on 10/07/2020 02/24/19   McVey, Prudencio Pair, CNM    Social History: She  reports that she has never smoked. She has never used smokeless tobacco. She reports that she does not drink alcohol and does not use drugs.  Family History: family history includes Hypertension in her maternal grandmother.   Review of Systems: A full review of systems was performed and negative except as noted in the HPI.    O:  BP 109/84 (BP Location: Left Arm)   Pulse 81   Temp 97.8 F (36.6 C) (Oral)   Resp 16   Ht 5' (1.524 m)   Wt 74.4 kg   LMP 02/13/2020 (Approximate)   SpO2 99%   BMI 32.03 kg/m  Results for orders placed or performed during the hospital encounter of 10/07/20 (from the past 48 hour(s))  Respiratory Panel by RT PCR (Flu A&B, Covid) - Vaginal   Collection Time: 10/08/20 12:03 AM   Specimen: Vaginal; Nasopharyngeal  Result Value Ref Range   SARS Coronavirus 2 by RT PCR NEGATIVE NEGATIVE   Influenza A by PCR NEGATIVE NEGATIVE   Influenza B by PCR NEGATIVE NEGATIVE  Wet prep, genital   Collection Time: 10/08/20 12:03 AM   Specimen: Vaginal  Result Value Ref Range   Yeast Wet Prep HPF POC NONE SEEN NONE SEEN   Trich, Wet Prep NONE SEEN NONE SEEN  Clue Cells Wet Prep HPF POC PRESENT (A) NONE SEEN   WBC, Wet Prep HPF POC MANY (A) NONE SEEN   Sperm NONE SEEN   Chlamydia/NGC rt PCR ([facility] only)   Collection Time: 10/08/20 12:03 AM   Specimen: Urine  Result Value Ref Range   Specimen source GC/Chlam URINE, RANDOM    Chlamydia Tr NOT DETECTED NOT DETECTED   N gonorrhoeae NOT DETECTED NOT DETECTED  ROM Plus ([facility] only)   Collection Time: 10/08/20 12:03 AM  Result Value Ref Range   Rom Plus NEGATIVE   Urinalysis, Routine w reflex microscopic Vaginal   Collection Time: 10/08/20 12:03 AM  Result Value Ref Range    Color, Urine STRAW (A) YELLOW   APPearance CLEAR (A) CLEAR   Specific Gravity, Urine 1.002 (L) 1.005 - 1.030   pH 6.0 5.0 - 8.0   Glucose, UA NEGATIVE NEGATIVE mg/dL   Hgb urine dipstick NEGATIVE NEGATIVE   Bilirubin Urine NEGATIVE NEGATIVE   Ketones, ur NEGATIVE NEGATIVE mg/dL   Protein, ur NEGATIVE NEGATIVE mg/dL   Nitrite NEGATIVE NEGATIVE   Leukocytes,Ua NEGATIVE NEGATIVE  CBC   Collection Time: 10/08/20 12:28 AM  Result Value Ref Range   WBC 8.4 4.0 - 10.5 K/uL   RBC 3.70 (L) 3.87 - 5.11 MIL/uL   Hemoglobin 8.6 (L) 12.0 - 15.0 g/dL   HCT 14.9 (L) 36 - 46 %   MCV 74.9 (L) 80.0 - 100.0 fL   MCH 23.2 (L) 26.0 - 34.0 pg   MCHC 31.0 30.0 - 36.0 g/dL   RDW 70.2 63.7 - 85.8 %   Platelets 307 150 - 400 K/uL   nRBC 0.0 0.0 - 0.2 %  Comprehensive metabolic panel   Collection Time: 10/08/20 12:28 AM  Result Value Ref Range   Sodium 136 135 - 145 mmol/L   Potassium 3.6 3.5 - 5.1 mmol/L   Chloride 106 98 - 111 mmol/L   CO2 22 22 - 32 mmol/L   Glucose, Bld 87 70 - 99 mg/dL   BUN 6 6 - 20 mg/dL   Creatinine, Ser 8.50 (L) 0.44 - 1.00 mg/dL   Calcium 8.2 (L) 8.9 - 10.3 mg/dL   Total Protein 6.8 6.5 - 8.1 g/dL   Albumin 2.7 (L) 3.5 - 5.0 g/dL   AST 23 15 - 41 U/L   ALT 11 0 - 44 U/L   Alkaline Phosphatase 172 (H) 38 - 126 U/L   Total Bilirubin 0.7 0.3 - 1.2 mg/dL   GFR, Estimated >27 >74 mL/min   Anion gap 8 5 - 15  RPR   Collection Time: 10/08/20 12:28 AM  Result Value Ref Range   RPR Ser Ql NON REACTIVE NON REACTIVE  HIV Antibody (routine testing w rflx)   Collection Time: 10/08/20 12:28 AM  Result Value Ref Range   HIV Screen 4th Generation wRfx Non Reactive Non Reactive  Hepatitis B surface antigen   Collection Time: 10/08/20 12:28 AM  Result Value Ref Range   Hepatitis B Surface Ag NON REACTIVE NON REACTIVE  Type and screen [facility]   Collection Time: 10/08/20 12:28 AM  Result Value Ref Range   ABO/RH(D) O POS    Antibody Screen NEG     Sample Expiration      10/11/2020,2359 Performed at [facility], [address], [place], [place] [address]   Urine Drug Screen, Qualitative [facility] only)   Collection Time: 10/08/20  9:00 AM  Result Value Ref Range   Tricyclic,  Ur Screen NONE DETECTED NONE DETECTED   Amphetamines, Ur Screen NONE DETECTED NONE DETECTED   MDMA (Ecstasy)Ur Screen NONE DETECTED NONE DETECTED   Cocaine Metabolite,Ur [place] NONE DETECTED NONE DETECTED   Opiate, Ur Screen NONE DETECTED NONE DETECTED   Phencyclidine (PCP) Ur S NONE DETECTED NONE DETECTED   Cannabinoid 50 Ng, Ur [place] NONE DETECTED NONE DETECTED   Barbiturates, Ur Screen NONE DETECTED NONE DETECTED   Benzodiazepine, Ur Scrn NONE DETECTED NONE DETECTED   Methadone Scn, Ur NONE DETECTED NONE DETECTED  Hemoglobin A1c   Collection Time: 10/08/20  9:37 AM  Result Value Ref Range   Hgb A1c MFr Bld 5.4 4.8 - 5.6 %   Mean Plasma Glucose 108.28 mg/dL     Constitutional: NAD, AAOx3  HE/ENT: extraocular movements grossly intact, moist mucous membranes CV: RRR PULM: nl respiratory effort, CTABL     Abd: gravid, non-tender, non-distended, soft, no rebound tenderness    Ext: Non-tender, Nonedmeatous   Psych: mood appropriate, speech normal Pelvic : deferred  Fetal Monitor: Baseline: 130 bpm, Variability: moderate, Accelerations: present and Decelerations: Absent Toco: occasional, mild contractions  Category: 1   Assessment: 34 y.o. [redacted]w[redacted]d here for antenatal surveillance during pregnancy.  Principle diagnosis: abdominal pain in pregnancy, bacterial vaginosis    Plan:  Labor: not present.    Wet prep c/w BV, rx for flagyl given   NST reactive   Fetal Wellbeing: Reassuring Cat 1 tracing.  [place] for RUQ reassuring - no evidence of gallbladder disease   OB US showed normal anatomy but potential for LGA.  Infant measuring ahead approximately 2-3 weeks ahead of LMP.  Will need growth [place] in 3-4 weeks   HgA1C and random glucose were normal  today, GDM unlikely.    Per ACOG dating guidelines, recommend keeping EDD 11/19/2020 based on LMP.    Prenatal labs collected today   D/c home stable, precautions reviewed, follow-up as scheduled.   Instructed to follow up with [facility] to initiate prenatal care   ----- Margaretmary Eddy, CNM Certified Nurse Midwife [facility]

## 2020-10-08 NOTE — OB Triage Note (Signed)
Discharge orders received. Patient given prescription for Flagyl and instructions given on medication. Patient states understanding. Patient instructed to follow up with [facility] as soon as possible, give patient address and phone number for [facility]. Patient verbalizes understanding. FM+, No CTX. Pt left ambulatory.

## 2020-10-08 NOTE — Progress Notes (Signed)
Patient ID: Maria Gates, female   DOB: 08-05-1986, 34 y.o.   MRN: [id number]  Maria Gates is a 34 y.o. female. She is at [redacted]w[redacted]d gestation. Patient's last menstrual period was 02/13/2020 (approximate). Estimated Date of Delivery: 11/19/20. No PNC  Present with a 3 day h/o right mid abdominal pain exacerbated with cough . Respiratory panel negative this am  Some nausea , no emesis One prior c/s and subsequent VBAC .  Prior deliveries with [facility]  Prenatal care site: Chief complaint:  Location: Onset/timing: Duration: Quality: sharp  Severity:mod Aggravating or alleviating conditions: Associated signs/symptoms: Context:    Maternal Medical History:   Past Medical History:  Diagnosis Date  . Depression   . Medical history non-contributory     Past Surgical History:  Procedure Laterality Date  . CESAREAN SECTION N/A 01/02/2017   Procedure: CESAREAN SECTION;  Surgeon: Linzie Collin, MD;  Location: [facility] ORS;  Service: Obstetrics;  Laterality: N/A;    No Known Allergies  Prior to Admission medications   Medication Sig Start Date End Date Taking? Authorizing Provider  acetaminophen (TYLENOL) 325 MG tablet Take 2 tablets (650 mg total) by mouth every 4 (four) hours as needed (for pain scale < 4). 02/24/19  Yes McVey, Prudencio Pair, CNM  ferrous sulfate 325 (65 FE) MG tablet Take 1 tablet (325 mg total) by mouth 2 (two) times daily with a meal. Patient not taking: Reported on 01/11/2019 01/04/17   Linzie Collin, MD  ibuprofen (ADVIL,MOTRIN) 600 MG tablet Take 1 tablet (600 mg total) by mouth every 6 (six) hours. Patient not taking: Reported on 10/07/2020 02/24/19   McVey, Prudencio Pair, CNM  Prenatal Vit-Fe Fumarate-FA (PRENATAL MULTIVITAMIN) TABS tablet Take 1 tablet by mouth daily at 12 noon. Patient not taking: Reported on 10/07/2020    [provider]  senna-docusate (SENOKOT-S) 8.6-50 MG tablet Take 2 tablets by mouth daily. Patient not taking: Reported on 10/07/2020  02/25/19   McVey, Prudencio Pair, CNM  vitamin C (VITAMIN C) 500 MG tablet Take 1 tablet (500 mg total) by mouth 2 (two) times daily with a meal. Patient not taking: Reported on 10/07/2020 02/24/19   McVey, Prudencio Pair, CNM     Social History: She  reports that she has never smoked. She has never used smokeless tobacco. She reports that she does not drink alcohol and does not use drugs.  Family History: family history includes Hypertension in her maternal grandmother.  no history of gyn cancers  Review of Systems: A full review of systems was performed and negative except as noted in the HPI.     O:  BP 116/76 (BP Location: Left Arm)   Pulse 91   Temp 98.2 F (36.8 C) (Oral)   Resp 16   Ht 5' (1.524 m)   Wt 74.4 kg   LMP 02/13/2020 (Approximate)   SpO2 99%   BMI 32.03 kg/m  Results for orders placed or performed during the hospital encounter of 10/07/20 (from the past 48 hour(s))  Respiratory Panel by RT PCR (Flu A&B, Covid) - Vaginal   Collection Time: 10/08/20 12:03 AM   Specimen: Vaginal; Nasopharyngeal  Result Value Ref Range   SARS Coronavirus 2 by RT PCR NEGATIVE NEGATIVE   Influenza A by PCR NEGATIVE NEGATIVE   Influenza B by PCR NEGATIVE NEGATIVE  Wet prep, genital   Collection Time: 10/08/20 12:03 AM   Specimen: Vaginal  Result Value Ref Range   Yeast Wet Prep HPF POC NONE SEEN NONE SEEN  Trich, Wet Prep NONE SEEN NONE SEEN   Clue Cells Wet Prep HPF POC PRESENT (A) NONE SEEN   WBC, Wet Prep HPF POC MANY (A) NONE SEEN   Sperm NONE SEEN   Chlamydia/NGC rt PCR ([facility] only)   Collection Time: 10/08/20 12:03 AM   Specimen: Urine  Result Value Ref Range   Specimen source GC/Chlam URINE, RANDOM    Chlamydia Tr NOT DETECTED NOT DETECTED   N gonorrhoeae NOT DETECTED NOT DETECTED  ROM Plus ([facility] only)   Collection Time: 10/08/20 12:03 AM  Result Value Ref Range   Rom Plus NEGATIVE   Urinalysis, Routine w reflex microscopic Vaginal   Collection Time: 10/08/20 12:03 AM   Result Value Ref Range   Color, Urine STRAW (A) YELLOW   APPearance CLEAR (A) CLEAR   Specific Gravity, Urine 1.002 (L) 1.005 - 1.030   pH 6.0 5.0 - 8.0   Glucose, UA NEGATIVE NEGATIVE mg/dL   Hgb urine dipstick NEGATIVE NEGATIVE   Bilirubin Urine NEGATIVE NEGATIVE   Ketones, ur NEGATIVE NEGATIVE mg/dL   Protein, ur NEGATIVE NEGATIVE mg/dL   Nitrite NEGATIVE NEGATIVE   Leukocytes,Ua NEGATIVE NEGATIVE  CBC   Collection Time: 10/08/20 12:28 AM  Result Value Ref Range   WBC 8.4 4.0 - 10.5 K/uL   RBC 3.70 (L) 3.87 - 5.11 MIL/uL   Hemoglobin 8.6 (L) 12.0 - 15.0 g/dL   HCT 37.9 (L) 36 - 46 %   MCV 74.9 (L) 80.0 - 100.0 fL   MCH 23.2 (L) 26.0 - 34.0 pg   MCHC 31.0 30.0 - 36.0 g/dL   RDW 02.4 09.7 - 35.3 %   Platelets 307 150 - 400 K/uL   nRBC 0.0 0.0 - 0.2 %  Comprehensive metabolic panel   Collection Time: 10/08/20 12:28 AM  Result Value Ref Range   Sodium 136 135 - 145 mmol/L   Potassium 3.6 3.5 - 5.1 mmol/L   Chloride 106 98 - 111 mmol/L   CO2 22 22 - 32 mmol/L   Glucose, Bld 87 70 - 99 mg/dL   BUN 6 6 - 20 mg/dL   Creatinine, Ser 2.99 (L) 0.44 - 1.00 mg/dL   Calcium 8.2 (L) 8.9 - 10.3 mg/dL   Total Protein 6.8 6.5 - 8.1 g/dL   Albumin 2.7 (L) 3.5 - 5.0 g/dL   AST 23 15 - 41 U/L   ALT 11 0 - 44 U/L   Alkaline Phosphatase 172 (H) 38 - 126 U/L   Total Bilirubin 0.7 0.3 - 1.2 mg/dL   GFR, Estimated >24 >26 mL/min   Anion gap 8 5 - 15  Type and screen [facility]   Collection Time: 10/08/20 12:28 AM  Result Value Ref Range   ABO/RH(D) O POS    Antibody Screen NEG    Sample Expiration      10/11/2020,2359 Performed at [facility], [address] [place], [place] [address]      Constitutional: NAD, AAOx3  HE/ENT: extraocular movements grossly intact, moist mucous membranes CV: RRR PULM: nl respiratory effort, CTABL     Abd: gravid + TTP right mid abd with deep palpation , no rebound TTP     Ext: Non-tender, Nonedmeatous   Psych:  mood appropriate, speech normal Pelvic deferred  NST:  Baseline: 120-130 Variability: moderate Accelerations present x >2 Decelerations absent Time    Assessment: 34 y.o. [redacted]w[redacted]d with abdominal pain c/w musculoskelatal pain  Reassuring fetal monitoring  No prenatal care  Iron deficient anemia P: IV Venofer 200 mg IV today . Set up for [facility] at [facility]  Social services consult      ----- Beverly Gust MD Attending Obstetrician and Gynecologist [facility], Department of OB/GYN [facility]

## 2020-10-08 NOTE — Progress Notes (Signed)
Pt transported downstairs for [place] via Wheelchair

## 2020-10-08 NOTE — Progress Notes (Signed)
Patient returned to OBS2.

## 2020-10-09 LAB — VARICELLA ZOSTER ANTIBODY, IGG: Varicella IgG: 153 index — ABNORMAL LOW (ref >165)

## 2020-10-09 LAB — RUBELLA SCREEN: Rubella: 1.08 index (ref >0.99)

## 2020-11-24 NOTE — L&D Delivery Note (Signed)
Delivery Note  First Stage: Labor onset: 1500 Augmentation: none Analgesia /Anesthesia intrapartum: epidural SROM at 1941  Second Stage: Complete dilation at 2136 Onset of pushing at 2145 FHR second stage Cat II variables to 70s with UCs.   Delivery of a viable female infant on 11/30/20 at 2151 by CNM delivery of fetal head in ROA position with restitution to ROT. NO nuchal cord, but loop of cord delivered at infants back with delivery of Anterior shoulder, then posterior shoulders delivered easily with gentle downward traction. Baby placed on mom's chest, and attended to by peds.  Cord double clamped after cessation of pulsation, cut by CNM Cord blood sample collected    Third Stage: Placenta delivered spontaneously intact with [facility] @ 2155 Placenta disposition: to pathology, COVID infection Uterine tone Firm / bleeding scant  NO vaginal, cervical or perineal lacerations identified  Repair n/a Est. Blood Loss (mL): 150  Complications: Cat II tracing, COVID.   Mom to postpartum.  Baby to Couplet care / Skin to Skin.  Newborn: Birth Weight: pending  Apgar Scores: 8/9 Feeding planned: both

## 2020-11-30 ENCOUNTER — Encounter: Payer: Self-pay | Admitting: Obstetrics and Gynecology

## 2020-11-30 ENCOUNTER — Inpatient Hospital Stay
Admission: EM | Admit: 2020-11-30 | Discharge: 2020-12-03 | DRG: 805 | Disposition: A | Discharge status: Home / Self Care | Payer: Medicaid Other | Attending: Obstetrics and Gynecology | Admitting: Obstetrics and Gynecology

## 2020-11-30 ENCOUNTER — Inpatient Hospital Stay: Payer: Medicaid Other | Admitting: Anesthesiology

## 2020-11-30 ENCOUNTER — Other Ambulatory Visit: Payer: Self-pay

## 2020-11-30 DIAGNOSIS — O9852 Other viral diseases complicating childbirth: Secondary | ICD-10-CM | POA: Diagnosis present

## 2020-11-30 DIAGNOSIS — Z23 Encounter for immunization: Secondary | ICD-10-CM

## 2020-11-30 DIAGNOSIS — O9902 Anemia complicating childbirth: Secondary | ICD-10-CM | POA: Diagnosis present

## 2020-11-30 DIAGNOSIS — O480 Post-term pregnancy: Principal | ICD-10-CM | POA: Diagnosis present

## 2020-11-30 DIAGNOSIS — D509 Iron deficiency anemia, unspecified: Secondary | ICD-10-CM | POA: Diagnosis present

## 2020-11-30 DIAGNOSIS — Z3A41 41 weeks gestation of pregnancy: Secondary | ICD-10-CM

## 2020-11-30 DIAGNOSIS — O34211 Maternal care for low transverse scar from previous cesarean delivery: Secondary | ICD-10-CM | POA: Diagnosis present

## 2020-11-30 DIAGNOSIS — U071 COVID-19: Secondary | ICD-10-CM | POA: Diagnosis present

## 2020-11-30 DIAGNOSIS — O34219 Maternal care for unspecified type scar from previous cesarean delivery: Secondary | ICD-10-CM | POA: Diagnosis present

## 2020-11-30 LAB — CBC
HCT: 35.3 % — ABNORMAL LOW (ref 36.0–46.0)
Hemoglobin: 10.9 g/dL — ABNORMAL LOW (ref 12.0–15.0)
MCH: 23.7 pg — ABNORMAL LOW (ref 26.0–34.0)
MCHC: 30.9 g/dL (ref 30.0–36.0)
MCV: 76.7 fL — ABNORMAL LOW (ref 80.0–100.0)
Platelets: 309 10*3/uL (ref 150–400)
RBC: 4.6 MIL/uL (ref 3.87–5.11)
RDW: 20.8 % — ABNORMAL HIGH (ref 11.5–15.5)
WBC: 7.9 10*3/uL (ref 4.0–10.5)
nRBC: 0 % (ref 0.0–0.2)

## 2020-11-30 LAB — URINE DRUG SCREEN, QUALITATIVE (ARMC ONLY)
Amphetamines, Ur Screen: NOT DETECTED
Barbiturates, Ur Screen: NOT DETECTED
Benzodiazepine, Ur Scrn: NOT DETECTED
Cannabinoid 50 Ng, Ur ~~LOC~~: NOT DETECTED
Cocaine Metabolite,Ur ~~LOC~~: NOT DETECTED
MDMA (Ecstasy)Ur Screen: NOT DETECTED
Methadone Scn, Ur: NOT DETECTED
Opiate, Ur Screen: NOT DETECTED
Phencyclidine (PCP) Ur S: NOT DETECTED
Tricyclic, Ur Screen: NOT DETECTED

## 2020-11-30 LAB — OB RESULTS CONSOLE GC/CHLAMYDIA
Chlamydia: NEGATIVE
Gonorrhea: NEGATIVE

## 2020-11-30 LAB — CHLAMYDIA/NGC RT PCR (ARMC ONLY)
Chlamydia Tr: NOT DETECTED
N gonorrhoeae: NOT DETECTED

## 2020-11-30 LAB — RESP PANEL BY RT-PCR (FLU A&B, COVID) ARPGX2
Influenza A by PCR: NEGATIVE
Influenza B by PCR: NEGATIVE
SARS Coronavirus 2 by RT PCR: POSITIVE — AB

## 2020-11-30 LAB — TYPE AND SCREEN
ABO/RH(D): O POS
Antibody Screen: NEGATIVE

## 2020-11-30 LAB — RAPID HIV SCREEN (HIV 1/2 AB+AG)
HIV 1/2 Antibodies: NONREACTIVE
HIV-1 P24 Antigen - HIV24: NONREACTIVE

## 2020-11-30 MED ORDER — FENTANYL 2.5 MCG/ML W/ROPIVACAINE 0.15% IN NS 100 ML EPIDURAL (ARMC)
12.0000 mL/h | EPIDURAL | Status: DC
Start: 2020-11-30 — End: 2020-12-01
  Administered 2020-11-30: 12 mL/h via EPIDURAL
  Filled 2020-11-30: qty 100

## 2020-11-30 MED ORDER — LACTATED RINGERS IV SOLN: 500.0000 mL | Freq: Once | INTRAVENOUS | Status: DC

## 2020-11-30 MED ORDER — LACTATED RINGERS IV SOLN: 500.0000 mL | INTRAVENOUS | Status: DC | PRN

## 2020-11-30 MED ORDER — EPHEDRINE 5 MG/ML INJ: 10.0000 mg | INTRAVENOUS | Status: DC | PRN

## 2020-11-30 MED ORDER — IBUPROFEN 600 MG PO TABS
600.0000 mg | ORAL_TABLET | Freq: Four times a day (QID) | ORAL | Status: DC
  Administered 2020-12-01 – 2020-12-03 (×10): 600 mg via ORAL
  Filled 2020-11-30 (×10): qty 1

## 2020-11-30 MED ORDER — LIDOCAINE HCL (PF) 1 % IJ SOLN
INTRAMUSCULAR | Status: AC
  Filled 2020-11-30: qty 30

## 2020-11-30 MED ORDER — SOD CITRATE-CITRIC ACID 500-334 MG/5ML PO SOLN: 30.0000 mL | ORAL | Status: DC | PRN

## 2020-11-30 MED ORDER — OXYTOCIN 10 UNIT/ML IJ SOLN
INTRAMUSCULAR | Status: AC
  Filled 2020-11-30: qty 2

## 2020-11-30 MED ORDER — OXYTOCIN-SODIUM CHLORIDE 30-0.9 UT/500ML-% IV SOLN
2.5000 [IU]/h | INTRAVENOUS | Status: DC
  Administered 2020-11-30: 2.5 [IU]/h via INTRAVENOUS
  Filled 2020-11-30 (×2): qty 500

## 2020-11-30 MED ORDER — MISOPROSTOL 200 MCG PO TABS
ORAL_TABLET | ORAL | Status: AC
  Filled 2020-11-30: qty 1

## 2020-11-30 MED ORDER — ONDANSETRON HCL 4 MG/2ML IJ SOLN: 4.0000 mg | Freq: Four times a day (QID) | INTRAMUSCULAR | Status: DC | PRN

## 2020-11-30 MED ORDER — AMMONIA AROMATIC IN INHA
RESPIRATORY_TRACT | Status: AC
  Filled 2020-11-30: qty 10

## 2020-11-30 MED ORDER — LIDOCAINE HCL (PF) 1 % IJ SOLN
INTRAMUSCULAR | Status: DC | PRN
  Administered 2020-11-30: 1 mL via SUBCUTANEOUS

## 2020-11-30 MED ORDER — OXYTOCIN BOLUS FROM INFUSION
333.0000 mL | Freq: Once | INTRAVENOUS | Status: AC
  Administered 2020-11-30: 333 mL via INTRAVENOUS

## 2020-11-30 MED ORDER — LIDOCAINE HCL (PF) 1 % IJ SOLN: 30.0000 mL | INTRAMUSCULAR | Status: DC | PRN

## 2020-11-30 MED ORDER — LIDOCAINE-EPINEPHRINE (PF) 1.5 %-1:200000 IJ SOLN
INTRAMUSCULAR | Status: DC | PRN
  Administered 2020-11-30: 3 mL via EPIDURAL

## 2020-11-30 MED ORDER — ACETAMINOPHEN 325 MG PO TABS: 650.0000 mg | ORAL_TABLET | ORAL | Status: DC | PRN

## 2020-11-30 MED ORDER — DIPHENHYDRAMINE HCL 50 MG/ML IJ SOLN: 12.5000 mg | INTRAMUSCULAR | Status: DC | PRN

## 2020-11-30 MED ORDER — LACTATED RINGERS IV SOLN: INTRAVENOUS | Status: DC

## 2020-11-30 MED ORDER — FENTANYL CITRATE (PF) 100 MCG/2ML IJ SOLN: 50.0000 ug | INTRAMUSCULAR | Status: DC | PRN

## 2020-11-30 MED ORDER — PHENYLEPHRINE 40 MCG/ML (10ML) SYRINGE FOR IV PUSH (FOR BLOOD PRESSURE SUPPORT): 80.0000 ug | PREFILLED_SYRINGE | INTRAVENOUS | Status: DC | PRN

## 2020-11-30 MED ORDER — SODIUM CHLORIDE 0.9 % IV SOLN
INTRAVENOUS | Status: DC | PRN
  Administered 2020-11-30 (×2): 5 mL via EPIDURAL

## 2020-11-30 NOTE — Anesthesia Preprocedure Evaluation (Addendum)
Anesthesia Evaluation  Patient identified by MRN, date of birth, ID band Patient awake    Reviewed: Allergy & Precautions, H&P , NPO status , Patient's Chart, lab work & pertinent test results  History of Anesthesia Complications Negative for: history of anesthetic complications  Airway Mallampati: III  TM Distance: <3 FB Neck ROM: full    Dental  (+) Chipped   Pulmonary neg pulmonary ROS, neg shortness of breath,    Pulmonary exam normal        Cardiovascular Exercise Tolerance: Good (-) hypertensionnegative cardio ROS Normal cardiovascular exam     Neuro/Psych PSYCHIATRIC DISORDERS    GI/Hepatic negative GI ROS,   Endo/Other    Renal/GU   negative genitourinary   Musculoskeletal   Abdominal   Peds  Hematology negative hematology ROS (+)   Anesthesia Other Findings TOLAC COVID 19 +  Cat II prior to epidural placement   Past Medical History: No date: Depression No date: Medical history non-contributory 01/11/2019: Microcytic anemia  Past Surgical History: 01/02/2017: CESAREAN SECTION; N/A     Comment:  Procedure: CESAREAN SECTION;  Surgeon: Linzie Collin, MD;  Location: [facility] ORS;  Service: Obstetrics;                Laterality: N/A;  BMI    Body Mass Index: 32.03 kg/m      Reproductive/Obstetrics (+) Pregnancy                            Anesthesia Physical Anesthesia Plan  ASA: III  Anesthesia Plan: Epidural   Post-op Pain Management:    Induction:   PONV Risk Score and Plan:   Airway Management Planned: Natural Airway  Additional Equipment:   Intra-op Plan:   Post-operative Plan:   Informed Consent: I have reviewed the patients History and Physical, chart, labs and discussed the procedure including the risks, benefits and alternatives for the proposed anesthesia with the patient or authorized representative who has indicated his/her  understanding and acceptance.     Dental Advisory Given  Plan Discussed with: Anesthesiologist  Anesthesia Plan Comments: (Patient reports no bleeding problems and no anticoagulant use.   Patient consented for risks of anesthesia including but not limited to:  - adverse reactions to medications - risk of bleeding, infection and or nerve damage from epidural that could lead to paralysis - risk of headache or failed epidural - nerve damage due to positioning - that if epidural is used for C-section that there is a chance of epidural failure requiring spinal placement or conversion to GA - Damage to heart, brain, lungs, other parts of body or loss of life  Patient voiced understanding.)        Anesthesia Quick Evaluation

## 2020-11-30 NOTE — Progress Notes (Signed)
Labor Progress Note  Maria Gates is a 35 y.o. [id number] at [redacted]w[redacted]d by LMP admitted for active labor  Subjective: pt c/o pain and pressure with UCs.   CNM at bedside due to Cat II tracing and no central monitoring. Dr Dalbert Garnet aware and has also been at bedside reviewing tracing.   Objective: BP 131/84 (BP Location: Left Arm)   Pulse 92   Temp 98.3 F (36.8 C) (Oral)   Resp 16   Ht 5' (1.524 m)   Wt 74.4 kg   LMP 02/13/2020 (Approximate)   BMI 32.03 kg/m  Notable VS details: reviewed   Fetal Assessment: FHT:  FHR: 145 bpm, variability: min-mod,  accelerations:  Present,  decelerations:  Present variables, 20-90 sec length, rapid decrease and return to baseline.   - SROM small amt  Meconium at 1941, varoiable decels became recurrent, then large gush of meconium with exam at 1956.  - cervix remains stretchy.  - Prolonged decel noted at 1957, maternal position changed, FSE and IUPC placed. Amnioinfusion started.  Category/reactivity:  Category II UC:   regular, every 2-4 minutes SVE:   8/75/-1, soft/posterior.   Membrane status: SROM 1941 Amniotic color: meconium  Labs: Lab Results  Component Value Date   WBC 7.9 11/30/2020   HGB 10.9 (L) 11/30/2020   HCT 35.3 (L) 11/30/2020   MCV 76.7 (L) 11/30/2020   PLT 309 11/30/2020    Assessment / Plan: Spontaneous labor, progressing normally  TOLAC at 41.4wks Cat II tracing, variable decels  Labor: progressing small amt since arrival, will consider pitocin if FHR tolerates and no spont cervical change. UCs MVUS inadequate.  Preeclampsia:  no e/o pre-e Fetal Wellbeing:  Category II, continue to monitor closely.  Pain Control:  epidural requested.  I/D:  PCR pending.  Anticipated MOD:  TOLAC, pelvic proven to 7#5  Randa Ngo, CNM 11/30/2020, 8:42 PM

## 2020-11-30 NOTE — OB Triage Note (Signed)
Pt c/o about ctx that started this am and they have gotten closer and closer. She also reports LOF this am

## 2020-11-30 NOTE — H&P (Signed)
OB History & Physical   History of Present Illness:  Chief Complaint: contractions  HPI:  Maria Gates is a 35 y.o. [id number] female at [redacted]w[redacted]d dated by LMP, with initial [place] dating at 34.1wks, EDD 11/18/20.  She presents to L&D for painful UCs since this am that worsened this afternoon. Reports small amt blood tinged mucus, small LOF this am but has not needed a pad. Reports active FM.   - denies pregnancy complications, no prior hx HTN or Diabetes.      Pregnancy Issues: 1. No Prenatal care- seen by [facility] 10/08/20 2. Anemia, Hgb 8.6, s/p blood transfusion last pregnancy at 35wks 3. Prior LTCS 12/2016 for mentum oblique presentation and arrested active labor, macrosomia 9#2.  S/p VBAC 01/2019   Maternal Medical History:   Past Medical History:  Diagnosis Date  . Depression   . Medical history non-contributory   . Microcytic anemia 01/11/2019    Past Surgical History:  Procedure Laterality Date  . CESAREAN SECTION N/A 01/02/2017   Procedure: CESAREAN SECTION;  Surgeon: Linzie Collin, MD;  Location: [facility] ORS;  Service: Obstetrics;  Laterality: N/A;   OB Hx: G1: term SVD 03/2009 G2: term SVD 09/2011 G3: SAB at 7.6wks 07/2012 G4: term SVD 06/2013  G5: term SVD 2016 G6: 40.2, LTCS mentum presentation, 12/2016, 9#2 G7: 41.3 VBAC 01/2019, 7#5 G8: current   No Known Allergies  Prior to Admission medications   Medication Sig Start Date End Date Taking? Authorizing Provider  acetaminophen (TYLENOL) 325 MG tablet Take 2 tablets (650 mg total) by mouth every 4 (four) hours as needed (for pain scale < 4). 02/24/19   Jahden Schara, Prudencio Pair, CNM  ferrous sulfate 325 (65 FE) MG tablet Take 1 tablet (325 mg total) by mouth 2 (two) times daily with a meal. Patient not taking: Reported on 01/11/2019 01/04/17   Linzie Collin, MD  ibuprofen (ADVIL,MOTRIN) 600 MG tablet Take 1 tablet (600 mg total) by mouth every 6 (six) hours. Patient not taking: Reported on 10/07/2020 02/24/19   Oleta Gunnoe, Prudencio Pair, CNM   Prenatal Vit-Fe Fumarate-FA (PRENATAL MULTIVITAMIN) TABS tablet Take 1 tablet by mouth daily at 12 noon. Patient not taking: Reported on 10/07/2020    [provider]  senna-docusate (SENOKOT-S) 8.6-50 MG tablet Take 2 tablets by mouth daily. Patient not taking: Reported on 10/07/2020 02/25/19   Taedyn Glasscock, Prudencio Pair, CNM  vitamin C (VITAMIN C) 500 MG tablet Take 1 tablet (500 mg total) by mouth 2 (two) times daily with a meal. Patient not taking: Reported on 10/07/2020 02/24/19   Sierra Bissonette, Prudencio Pair, CNM     Prenatal care site: no care received.   Social History: She  reports that she has never smoked. She has never used smokeless tobacco. She reports that she does not drink alcohol and does not use drugs.  Family History: family history includes Hypertension in her maternal grandmother.   Review of Systems: A full review of systems was performed and negative except as noted in the HPI.     Physical Exam:  Vital Signs: BP 131/84 (BP Location: Left Arm)   Pulse 92   Temp 98.3 F (36.8 C) (Oral)   Resp 16   LMP 02/13/2020 (Approximate)   General: no acute distress.  HEENT: normocephalic, atraumatic Heart: regular rate & rhythm.  No murmurs/rubs/gallops Lungs: clear to auscultation bilaterally, normal respiratory effort Abdomen: soft, gravid, non-tender;  EFW: 8.5lbs Pelvic:   External: Normal external female genitalia  Cervix: Dilation: 7 / Effacement (%):  80 / Station: -1    Extremities: non-tender, symmetric, no edema bilaterally.  DTRs: 2+  Neurologic: Alert & oriented x 3.    No results found for this or any previous visit (from the past 24 hour(s)).  Pertinent Results:  Prenatal Labs: Blood type/Rh O Pos  Antibody screen neg  Rubella Immune  Varicella NON-Immune  RPR NR  HBsAg Neg  HIV NR  GC neg  Chlamydia neg  Genetic screening Not done  1 hour GTT  not done, A1C 5.4 on 10/08/20  3 hour GTT   GBS  Not done, pending by PCR   FHT: 140bpm, moderate  variability, + accels, no decels TOCO: q2-22min, palp mild to mod.  SVE:  Dilation: 7 / Effacement (%): 80 / Station: -1    Cephalic by leopolds and SVE  No results found.  Assessment:  Maria Gates is a 35 y.o. [phone] female at [redacted]w[redacted]d with active labor, TOLAC.   Plan:  1. Admit to Labor & Delivery; consents reviewed and obtained - COVID swab on admit - Dr Dalbert Garnet notified of TOLAC admission in active labor.  - UDS, HIV, GBS ordered.   2. Fetal Well being  - Fetal Tracing: Cat I tracing - Group B Streptococcus ppx indicated: pending by PCR, term pregnancy, no prior hx GBS infected infant.  - Presentation: cephalic confirmed by exam   3. Routine OB: - Prenatal labs reviewed, as above - Rh O pos - CBC, T&S, RPR on admit - Clear fluids, IVF  4. Monitoring of Labor -  Contractions: external toco in place -  Pelvis proven to 7#5 -  Plan for AROM/Pitocin prn -  Plan for continuous fetal monitoring  -  Maternal pain control as desired; requesting regional anesthesia - Anticipate vaginal delivery  5. Post Partum Planning: - Infant feeding: TBD - Contraception: TBD - Flu/Tdap: offer prior to DC  [employer], CNM 11/30/20 7:07 PM

## 2020-11-30 NOTE — Discharge Summary (Addendum)
Obstetrical Discharge Summary  Patient Name: Maria Gates DOB: 1986/07/11 MRN: [id number]  Date of Admission: 11/30/2020 Date of Delivery: 11/30/20 Delivered by: Heloise Ochoa CNM Date of Discharge: 12/03/2020  Primary OB: none  DXA:JOINOMV'E last menstrual period was 02/13/2020 (approximate). EDC Estimated Date of Delivery: 11/19/20 Gestational Age at Delivery: [redacted]w[redacted]d   Antepartum complications:  1. No Prenatal care- seen by [facility] 10/08/20 2. Anemia, Hgb 8.6, s/p blood transfusion last pregnancy at 35wks 3. Prior LTCS 12/2016 for mentum oblique presentation and arrested active labor, macrosomia 9#2.  S/p VBAC 01/2019  Admitting Diagnosis: Active labor, 41.4wks; no prenatal care Secondary Diagnosis: VBAC, Meconium stained fluid.  Patient Active Problem List   Diagnosis Date Noted  . History of cesarean delivery, currently pregnant 01/11/2019  . Vaginal birth after cesarean (VBAC) 10/23/2015  . Insufficient prenatal care 10/23/2015    Augmentation: none Complications: None Intrapartum complications/course: presented in active labor, SROM with Cat II tracing. progressed to successful VBAC- see delivery note.  Date of Delivery: 11/30/20 Delivered By: Heloise Ochoa CNM Delivery Type: vaginal birth after cesarean (VBAC) Anesthesia: epidural Placenta: spontaneous Laceration: none Episiotomy: none Newborn Data: Live born female  Birth Weight:  7#15 APGAR: 8, 9  Newborn Delivery   Birth date/time: 11/30/2020 21:51:00 Delivery type: Vaginal, Spontaneous     Postpartum Procedures: none  Edinburgh:  Edinburgh Postnatal Depression Scale Screening Tool 12/02/2020 02/22/2019 02/22/2019  I have been able to laugh and see the funny side of things. 0 0 (No Data)  I have looked forward with enjoyment to things. 0 0 -  I have blamed myself unnecessarily when things went wrong. 0 0 -  I have been anxious or worried for no good reason. 0 1 -  I have felt scared or panicky for no good reason. 0 0 -   Things have been getting on top of me. 0 1 -  I have been so unhappy that I have had difficulty sleeping. 0 0 -  I have felt sad or miserable. 0 1 -  I have been so unhappy that I have been crying. 0 1 -  The thought of harming myself has occurred to me. 0 0 -  Edinburgh Postnatal Depression Scale Total 0 4 -      Post partum course:  Patient had an uncomplicated postpartum course.  By time of discharge on PPD#2, her pain was controlled on oral pain medications; she had appropriate lochia and was ambulating, voiding without difficulty and tolerating regular diet.  She was deemed stable for discharge to home.      Discharge Physical Exam:  BP 125/87 (BP Location: Left Arm)   Pulse 72   Temp 98.3 F (36.8 C) (Oral)   Resp 18   Ht 5' (1.524 m)   Wt 74.4 kg   LMP 02/13/2020 (Approximate)   SpO2 98%   Breastfeeding Unknown   BMI 32.03 kg/m   General: NAD CV: RRR Pulm: CTABL, nl effort ABD: s/nd/nt, fundus firm and below the umbilicus Lochia: moderate Perineum: well approximated/intact DVT Evaluation: LE non-ttp, no evidence of DVT on exam.  Hemoglobin  Date Value Ref Range Status  12/01/2020 10.1 (L) 12.0 - 15.0 g/dL Final   HGB  Date Value Ref Range Status  07/02/2013 10.9 (L) 12.0 - 16.0 g/dL Final   HCT  Date Value Ref Range Status  12/01/2020 32.0 (L) 36.0 - 46.0 % Final  07/03/2013 35.6 35.0 - 47.0 % Final     Disposition: stable, discharge to home. Baby  Feeding: breastmilk and formula Baby Disposition: home with mom  Rh Immune globulin given: n/a Rubella vaccine given: immune Varicella vaccine given: needs prior to DC Tdap vaccine given in AP or PP setting: offer prior to DC Flu vaccine given in AP or PP setting: offer prior to DC  Contraception: desires BTL  Prenatal Labs:  Blood type/Rh O Pos  Antibody screen neg  Rubella Immune  Varicella NON-Immune  RPR NR  HBsAg Neg  HIV NR  GC neg  Chlamydia neg  Genetic screening Not done  1 hour GTT   not done, A1C 5.4 on 10/08/20  3 hour GTT   GBS  Neg      Plan:  Maria Gates was discharged to home in good condition. Follow-up appointment with delivering provider in 2 weeks.  Discharge Medications: Allergies as of 12/03/2020   No Known Allergies     Medication List    TAKE these medications   acetaminophen 325 MG tablet Commonly known as: Tylenol Take 2 tablets (650 mg total) by mouth every 4 (four) hours as needed (for pain scale < 4).   ascorbic acid 500 MG tablet Commonly known as: VITAMIN C Take 1 tablet (500 mg total) by mouth 2 (two) times daily with a meal.   benzocaine-Menthol 20-0.5 % Aero Commonly known as: DERMOPLAST Apply 1 application topically as needed for irritation (perineal discomfort).   coconut oil Oil Apply 1 application topically as needed.   ferrous sulfate 325 (65 FE) MG tablet Take 1 tablet (325 mg total) by mouth 2 (two) times daily with a meal.   ibuprofen 600 MG tablet Commonly known as: ADVIL Take 1 tablet (600 mg total) by mouth every 6 (six) hours.   prenatal multivitamin Tabs tablet Take 1 tablet by mouth daily at 12 noon.   senna-docusate 8.6-50 MG tablet Commonly known as: Senokot-S Take 2 tablets by mouth daily. What changed: when to take this   simethicone 80 MG chewable tablet Commonly known as: MYLICON Chew 1 tablet (80 mg total) by mouth as needed for flatulence.   witch hazel-glycerin pad Commonly known as: TUCKS Apply 1 application topically as needed for hemorrhoids.        Follow-up Information    Christeen Douglas, MD Follow up in 2 week(s).   Specialty: Obstetrics and Gynecology Why: BTL consult Contact information: [address] [place] [address] [phone]               Signed:  Al Corpus 12/03/2020  8:43 AM  Margaretmary Eddy, CNM Certified Nurse Midwife [facility]

## 2020-11-30 NOTE — Anesthesia Procedure Notes (Signed)
Epidural Patient location during procedure: OB Start time: 11/30/2020 8:57 PM End time: 11/30/2020 9:02 PM  Staffing Anesthesiologist: Yousof Alderman, Cleda Mccreedy, MD Performed: anesthesiologist   Preanesthetic Checklist Completed: patient identified, IV checked, site marked, risks and benefits discussed, surgical consent, monitors and equipment checked, pre-op evaluation and timeout performed  Epidural Patient position: sitting Prep: ChloraPrep Patient monitoring: heart rate, continuous pulse ox and blood pressure Approach: midline Location: L3-L4 Injection technique: LOR saline  Needle:  Needle type: Tuohy  Needle gauge: 17 G Needle length: 9 cm and 9 Needle insertion depth: 5 cm Catheter type: closed end flexible Catheter size: 19 Gauge Catheter at skin depth: 10 cm Test dose: negative and 1.5% lidocaine with Epi 1:200 K  Assessment Sensory level: T10 Events: blood not aspirated, injection not painful, no injection resistance, no paresthesia and negative IV test  Additional Notes 1 attempt Pt. Evaluated and documentation done after procedure finished. Patient identified. Risks/Benefits/Options discussed with patient including but not limited to bleeding, infection, nerve damage, paralysis, failed block, incomplete pain control, headache, blood pressure changes, nausea, vomiting, reactions to medication both or allergic, itching and postpartum back pain. Confirmed with bedside nurse the patient's most recent platelet count. Confirmed with patient that they are not currently taking any anticoagulation, have any bleeding history or any family history of bleeding disorders. Patient expressed understanding and wished to proceed. All questions were answered. Sterile technique was used throughout the entire procedure. Please see nursing notes for vital signs. Test dose was given through epidural catheter and negative prior to continuing to dose epidural or start infusion. Warning signs of high  block given to the patient including shortness of breath, tingling/numbness in hands, complete motor block, or any concerning symptoms with instructions to call for help. Patient was given instructions on fall risk and not to get out of bed. All questions and concerns addressed with instructions to call with any issues or inadequate analgesia.   Patient tolerated the insertion well without immediate complications.Reason for block:procedure for pain

## 2020-12-01 ENCOUNTER — Encounter: Payer: Self-pay | Admitting: Obstetrics and Gynecology

## 2020-12-01 LAB — CBC
HCT: 32 % — ABNORMAL LOW (ref 36.0–46.0)
Hemoglobin: 10.1 g/dL — ABNORMAL LOW (ref 12.0–15.0)
MCH: 24 pg — ABNORMAL LOW (ref 26.0–34.0)
MCHC: 31.6 g/dL (ref 30.0–36.0)
MCV: 76.2 fL — ABNORMAL LOW (ref 80.0–100.0)
Platelets: 264 10*3/uL (ref 150–400)
RBC: 4.2 MIL/uL (ref 3.87–5.11)
RDW: 20.6 % — ABNORMAL HIGH (ref 11.5–15.5)
WBC: 8.2 10*3/uL (ref 4.0–10.5)
nRBC: 0 % (ref 0.0–0.2)

## 2020-12-01 LAB — GROUP B STREP BY PCR: Group B strep by PCR: NEGATIVE

## 2020-12-01 LAB — RPR: RPR Ser Ql: NONREACTIVE

## 2020-12-01 MED ORDER — PRENATAL MULTIVITAMIN CH
1.0000 | ORAL_TABLET | Freq: Every day | ORAL | Status: DC
  Administered 2020-12-01 – 2020-12-03 (×3): 1 via ORAL
  Filled 2020-12-01 (×2): qty 1

## 2020-12-01 MED ORDER — ONDANSETRON HCL 4 MG/2ML IJ SOLN: 4.0000 mg | INTRAMUSCULAR | Status: DC | PRN

## 2020-12-01 MED ORDER — ONDANSETRON HCL 4 MG PO TABS
4.0000 mg | ORAL_TABLET | ORAL | Status: DC | PRN
  Filled 2020-12-01: qty 1

## 2020-12-01 MED ORDER — WITCH HAZEL-GLYCERIN EX PADS: 1.0000 "application " | MEDICATED_PAD | CUTANEOUS | Status: DC | PRN

## 2020-12-01 MED ORDER — SENNOSIDES-DOCUSATE SODIUM 8.6-50 MG PO TABS
2.0000 | ORAL_TABLET | Freq: Every day | ORAL | Status: DC
  Administered 2020-12-01 – 2020-12-03 (×3): 2 via ORAL
  Filled 2020-12-01 (×3): qty 2

## 2020-12-01 MED ORDER — SIMETHICONE 80 MG PO CHEW: 80.0000 mg | CHEWABLE_TABLET | ORAL | Status: DC | PRN

## 2020-12-01 MED ORDER — COCONUT OIL OIL
1.0000 "application " | TOPICAL_OIL | Status: DC | PRN
  Filled 2020-12-01 (×2): qty 120

## 2020-12-01 MED ORDER — DIPHENHYDRAMINE HCL 25 MG PO CAPS: 25.0000 mg | ORAL_CAPSULE | Freq: Four times a day (QID) | ORAL | Status: DC | PRN

## 2020-12-01 MED ORDER — MEDROXYPROGESTERONE ACETATE 150 MG/ML IM SUSP
150.0000 mg | INTRAMUSCULAR | Status: AC | PRN
  Administered 2020-12-03: 150 mg via INTRAMUSCULAR
  Filled 2020-12-01 (×2): qty 1

## 2020-12-01 MED ORDER — TETANUS-DIPHTH-ACELL PERTUSSIS 5-2.5-18.5 LF-MCG/0.5 IM SUSY
0.5000 mL | PREFILLED_SYRINGE | Freq: Once | INTRAMUSCULAR | Status: DC
  Filled 2020-12-01: qty 0.5

## 2020-12-01 MED ORDER — BENZOCAINE-MENTHOL 20-0.5 % EX AERO
1.0000 "application " | INHALATION_SPRAY | CUTANEOUS | Status: DC | PRN
  Filled 2020-12-01: qty 56

## 2020-12-01 MED ORDER — FERROUS SULFATE 325 (65 FE) MG PO TABS
325.0000 mg | ORAL_TABLET | Freq: Two times a day (BID) | ORAL | Status: DC
  Administered 2020-12-01 – 2020-12-03 (×4): 325 mg via ORAL
  Filled 2020-12-01 (×5): qty 1

## 2020-12-01 MED ORDER — VARICELLA VIRUS VACCINE LIVE 1350 PFU/0.5ML IJ SUSR
0.5000 mL | INTRAMUSCULAR | Status: AC | PRN
  Administered 2020-12-03: 0.5 mL via SUBCUTANEOUS
  Filled 2020-12-01 (×2): qty 0.5

## 2020-12-01 MED ORDER — ACETAMINOPHEN 325 MG PO TABS
650.0000 mg | ORAL_TABLET | ORAL | Status: DC | PRN
  Administered 2020-12-02 (×2): 650 mg via ORAL
  Filled 2020-12-01 (×2): qty 2

## 2020-12-01 MED ORDER — DIBUCAINE (PERIANAL) 1 % EX OINT: 1.0000 "application " | TOPICAL_OINTMENT | CUTANEOUS | Status: DC | PRN

## 2020-12-01 MED ORDER — ZOLPIDEM TARTRATE 5 MG PO TABS: 5.0000 mg | ORAL_TABLET | Freq: Every evening | ORAL | Status: DC | PRN

## 2020-12-01 NOTE — Anesthesia Postprocedure Evaluation (Addendum)
Anesthesia Post Note  Patient: Maria Gates  Procedure(s) Performed: AN AD HOC LABOR EPIDURAL  Patient location during evaluation: Mother Baby Anesthesia Type: Epidural Level of consciousness: awake and alert Pain management: pain level controlled Vital Signs Assessment: post-procedure vital signs reviewed and stable Respiratory status: spontaneous breathing, nonlabored ventilation and respiratory function stable Cardiovascular status: stable Postop Assessment: no headache, no backache, adequate PO intake, able to ambulate and no apparent nausea or vomiting Anesthetic complications: no   No complications documented.   Last Vitals:  Vitals:   12/01/20 0525 12/01/20 1648  BP: 108/73   Pulse: 61   Resp: 17 18  Temp: 36.6 C 36.7 C  SpO2:      Last Pain:  Vitals:   12/01/20 1648  TempSrc: Oral  PainSc:                  Lenard Simmer

## 2020-12-01 NOTE — Lactation Note (Signed)
This note was copied from a baby's chart. Lactation Consultation Note  Patient Name: Maria Gates [id number] Date: 12/01/2020 Reason for consult: Follow-up assessment;Mother's request;Term;Other (Comment) Age:35 years  Mom struggling to get Maria Gates latched.  Assisted mom in comfortable position with pillow support with Maria Gates in cradle hold.  Demonstrated hand expression and mom could easily hand express lots of colostrum.  After several attempts, she latched well and began strong, rhythmic sucking with occasional audible swallows.  Mom denies any breast or nipple pain.  She breast fed on left breast for 14 minutes and then on the right breast for 12 minutes.  Mom wants to pump in case she struggles to get Maria Gates to latch on her own tonight.  Encouraged mom to ask for help from the nurses as well.  Discouraged bottles of formula since she could hand express so much colostrum.  Symphony set up in room with instructions in breast massage, hand expression, pumping using manual and electric pump, collection, storage, cleaning, labeling and handling expressed milk.  Mom tried to feed her expressed milk via bottle.  She took 3 of the 6 ml pumped and then started pushing the bottle out with her tongue.  When mom put her back in the crib, Maria Gates started rooting and sucking on her hand again.  Pointed out these feeding cues to mom and encouraged her to put Maria Gates to the breast whenever she demonstrated these hunger cues.  Mom willing to put her back to the breast now.  She latched after a few attempts and once she got a deep enough latch nursed for another 15 minutes.  Hand expressed some colostrum after breast feeding and rubbed on her nipples to prevent bacteria, lubricate and for comfort.  Hand out given on what to expect with feeding the first 4 days of life reviewing normal newborn stomach size, supply and demand, normal course of lactation and routine newborn feeding patterns.  Lactation [employer] and LLL hand  out given reviewing contact numbers, support groups and informative websites.  Encouraged mom to call lactation for assistance with breast feeding.   Maternal Data Formula Feeding for Exclusion: No Has patient been taught Hand Expression?: Yes Does the patient have breastfeeding experience prior to this delivery?: No (Attempted to breast feed others - would not latch - pumped for 2 weeks)  Feeding Feeding Type: Breast Fed  LATCH Score Latch: Repeated attempts needed to sustain latch, nipple held in mouth throughout feeding, stimulation needed to elicit sucking reflex.  Audible Swallowing: A few with stimulation  Type of Nipple: Everted at rest and after stimulation  Comfort (Breast/Nipple): Soft / non-tender  Hold (Positioning): Assistance needed to correctly position infant at breast and maintain latch.  LATCH Score: 7  Interventions Interventions: Breast feeding basics reviewed;Assisted with latch;Skin to skin;Breast massage;Hand express;Breast compression;Adjust position;Pre-pump if needed;Support pillows;Position options;DEBP  Lactation Tools Discussed/Used Tools: Pump (Prefers #27 flanges) Breast pump type: Double-Electric Breast Pump;Manual WIC Program: No (Does not have insurance either) Pump Education: Setup, frequency, and cleaning;Milk Storage;Other (comment) Initiated by:: S.Jupiter Boys,RN,BSN,IBCLC Date initiated:: 12/01/20   Consult Status Consult Status: Follow-up Date: 12/01/20 Follow-up type:  (Encouraged mom to call for lactation assistance)    Louis Meckel 12/01/2020, 8:04 PM

## 2020-12-01 NOTE — Anesthesia Post-op Follow-up Note (Signed)
  Anesthesia Pain Follow-up Note  Patient: Maria Gates  Day #: 1  Date of Follow-up: 12/01/2020 Time: 5:00 PM  Last Vitals:  Vitals:   12/01/20 0525 12/01/20 1648  BP: 108/73   Pulse: 61   Resp: 17 18  Temp: 36.6 C 36.7 C  SpO2:      Level of Consciousness: alert  Pain: mild   Side Effects:None  Catheter Site Exam:clean, dry     Plan: D/C from anesthesia care at surgeon's request  Lenard Simmer

## 2020-12-01 NOTE — Progress Notes (Signed)
Post Partum Day 1 Subjective: Doing well, no complaints.  Tolerating regular diet, pain with PO meds, voiding and ambulating without difficulty.  No CP SOB Fever,Chills, N/V or leg pain; denies nipple or breast pain, no HA change of vision, RUQ/epigastric pain  Objective: BP 108/73   Pulse 61   Temp 97.9 F (36.6 C) (Oral)   Resp 17   Ht 5' (1.524 m)   Wt 74.4 kg   LMP 02/13/2020 (Approximate)   SpO2 99%   Breastfeeding Unknown   BMI 32.03 kg/m    Physical Exam:  General: NAD Breasts: soft/nontender CV: RRR Pulm: nl effort, CTABL Abdomen: soft, NT, BS x 4 Perineum: minimal edema, intact Lochia: small Uterine Fundus: fundus firm and 2 fb below umbilicus DVT Evaluation: no cords, ttp LEs   Recent Labs    11/30/20 1858  HGB 10.9*  HCT 35.3*  WBC 7.9  PLT 309    Assessment/Plan: 35 y.o. [id number] postpartum day # 1  - Continue routine PP care - Lactation consult prn.  - Discussed contraceptive options including implant, IUDs hormonal and non-hormonal, injection, pills/ring/patch, condoms, and NFP. Pt desires BTL- will have pt make appt with Dr Dalbert Garnet for 2 wk f/u - Immunization status: all Imms up to date   Disposition: Does not desire Dc home today.    Randa Ngo, CNM 12/01/2020  10:34 AM

## 2020-12-02 MED ORDER — SIMETHICONE 80 MG PO CHEW: 80.0000 mg | CHEWABLE_TABLET | ORAL | 0 refills | Status: DC | PRN

## 2020-12-02 MED ORDER — BENZOCAINE-MENTHOL 20-0.5 % EX AERO: 1.0000 "application " | INHALATION_SPRAY | CUTANEOUS | Status: DC | PRN

## 2020-12-02 MED ORDER — WITCH HAZEL-GLYCERIN EX PADS: 1.0000 "application " | MEDICATED_PAD | CUTANEOUS | 12 refills | Status: DC | PRN

## 2020-12-02 MED ORDER — INFLUENZA VAC SPLIT QUAD 0.5 ML IM SUSY: 0.5000 mL | PREFILLED_SYRINGE | INTRAMUSCULAR | Status: DC

## 2020-12-02 MED ORDER — IBUPROFEN 600 MG PO TABS: 600.0000 mg | ORAL_TABLET | Freq: Four times a day (QID) | ORAL | 0 refills | Status: DC

## 2020-12-02 MED ORDER — COCONUT OIL OIL: 1.0000 "application " | TOPICAL_OIL | 0 refills | Status: DC | PRN

## 2020-12-02 MED ORDER — SENNOSIDES-DOCUSATE SODIUM 8.6-50 MG PO TABS: 2.0000 | ORAL_TABLET | Freq: Every day | ORAL | 0 refills | Status: DC

## 2020-12-02 NOTE — Progress Notes (Signed)
Pt transferred to Room 334, placed on airborne and contact precautions (continued from L&D).

## 2020-12-02 NOTE — Clinical Social Work Maternal (Addendum)
CLINICAL SOCIAL WORK MATERNAL/CHILD NOTE  Patient Details  Name: Maria Gates MRN: [id number] Date of Birth: 1986/09/15  Date:  12/02/2020  Clinical Social Worker Initiating Note:  Patient with no prenatal care. Does not have a PCP. Date/Time: Initiated:  12/02/20/1035     Child's Name:  Maria Gates   Biological Parents:  Mother,Father Maria Gates 33 years old)   Need for Interpreter:  None   Reason for Referral:  Late or No Prenatal Care    Address:  [address] [place] [address]    Phone number:  [phone] (home)     Additional phone number: [phone], patient's phone number   Household Members/Support Persons (HM/SP):   Household Member/Support Person 1,Household Member/Support Person 2,Household Member/Support Person 3,Household Member/Support Person 4,Household Member/Support Person 5,Household Member/Support Person 6,Household Member/Support Person 7   HM/SP Name Relationship DOB or Age  HM/SP -1 Maria Gates, Mother    HM/SP -2 Maria Gates daughter 34  HM/SP -3 Maria Gates daughter 41  HM/SP -4 Maria Gates daughter 8  HM/SP -5 Maria Gates daughter 5  HM/SP -6 Maria Gates son 3  HM/SP -7 Maria Gates daughter 2  HM/SP -8          Natural Supports (not living in the home):  Immediate Family   Professional Supports:     Employment: Unemployed   Type of Work:     Education:  Engineer, agricultural   Homebound arranged:    Surveyor, quantity Resources:      Other Resources:      Cultural/Religious Considerations Which May Impact Care:  none identified   Strengths:      Psychotropic Medications:         Pediatrician:       Pediatrician List:   [facility]      Pediatrician Fax Number: Plans on taking child to [facility]  Risk Factors/Current Problems:      Cognitive State:   Alert    Mood/Affect:  Other (Comment) (Unable to assess. Telephone assessment due to positive coronavirus)   CSW Assessment: Telephone assessment due to positive coronavirus.  Precautionary measures taken.   CSW reviewed the patient's chart and made telephone call to the patient's room.   CSW explained to the patient reason for the consult. No prenatal care.  Patient is a 35 year old female who gave birth to a baby girl on 11/30/2020. Patient reported that she lives with her mother along with her 6 children (ages listed below). She reported that she did not have money for medical visit. Does not have Medicaid, WIC, or food stamps set up. No other explanation given when asked about the father of her newborn. She denied current or past DSS/CPS involvement. When asked if home was prepared for her newborn she stated "my mother is supposed to get a basinet or crib.  I am not sure."   CSW encouraged MOP to establish care with primary care provider and pediatrician. CSW explained that [place] DSS/CPS reported is warranted due to lack of prenatal care and services not set up for her newborn. Mother demonstrated good understanding and noted that she could be reached at [phone].   CSW Plan/Description:  Child Protective Service Report  (wating for on call social worker.)  [place] DSS//CPS reported completed on 12/02/2020 1123. Report accepted and filed by  weekend on-call social worker.    Maria Gates I Maria Popov, LCSW 12/02/2020, 10:54 AM

## 2020-12-02 NOTE — Lactation Note (Signed)
This note was copied from a baby's chart. Lactation Consultation Note  Patient Name: Maria Gates DGLOV'F Date: 12/02/2020 Reason for consult: Follow-up assessment;Mother's request;Term;Other (Comment) (Mom (+) Covid.  Gave bottles of formula last night even though discouraged.) Age:33 hours  Mom requesting assistance with breast feeding when went in to check on her.  As soon as checked [name]'s diarper, she started givening feeding cues.  Mom had just been moved to a different room on M/B unit.  Assisted mom with positioning with pillow support.  Demonstrated to mom how she can easily hand express lots of colostrum discouraging mom from giving any formula tonight.  Reviewed supply and demand and stressed importance of frequent breast feeding whenever she demonstrates feeding cues to bring in mature milk, ensure a plentiful milk supply and prevent engorgement.  [name] latched with minimal assistance and began strong, rhythmic sucking with audible swallows for 10 minutes.  Mom denies any breast or nipple pain.  Coconut oil given with instructions in use.  Symphony set up in new room if needed tonight.  Lactation name and number written on white board and encouraged to call with any questions, concerns or assistance. Maternal Data Formula Feeding for Exclusion: No Has patient been taught Hand Expression?: Yes Does the patient have breastfeeding experience prior to this delivery?: Yes  Feeding Feeding Type: Breast Fed  LATCH Score Latch: Grasps breast easily, tongue down, lips flanged, rhythmical sucking.  Audible Swallowing: Spontaneous and intermittent  Type of Nipple: Everted at rest and after stimulation  Comfort (Breast/Nipple): Soft / non-tender  Hold (Positioning): Assistance needed to correctly position infant at breast and maintain latch.  LATCH Score: 9  Interventions Interventions: Breast feeding basics reviewed;Assisted with latch;Skin to skin;Breast massage;Hand express;Breast  compression;Adjust position;Support pillows;Position options;Coconut oil;DEBP  Lactation Tools Discussed/Used WIC Program: No (Does not have insurance either) Pump Education: Setup, frequency, and cleaning;Milk Storage;Other (comment) Initiated by:: S.Teryn Boerema,RNC,BSN,IBCLC Date initiated:: 12/01/20   Consult Status Consult Status: PRN Follow-up type: Call as needed    Maria Gates 12/02/2020, 8:01 PM

## 2020-12-02 NOTE — Progress Notes (Signed)
Post Partum Day 2 Subjective: Doing well, no complaints.  Tolerating regular diet, pain with PO meds, voiding and ambulating without difficulty.  No CP SOB Fever,Chills, N/V or leg pain; denies nipple or breast pain, no HA change of vision, RUQ/epigastric pain  Objective: BP 108/71   Pulse 71   Temp 97.7 F (36.5 C) (Oral)   Resp 16   Ht 5' (1.524 m)   Wt 74.4 kg   LMP 02/13/2020 (Approximate)   SpO2 98%   Breastfeeding Unknown   BMI 32.03 kg/m    Physical Exam:  General: NAD Breasts: soft/nontender CV: RRR Pulm: nl effort, CTABL Abdomen: soft, NT, BS x 4 Perineum: minimal edema, intact Lochia: small Uterine Fundus: fundus firm and 2 fb below umbilicus DVT Evaluation: no cords, ttp LEs   Recent Labs    11/30/20 1858 12/01/20 1138  HGB 10.9* 10.1*  HCT 35.3* 32.0*  WBC 7.9 8.2  PLT 309 264    Assessment/Plan: 35 y.o. [id number] postpartum day # 2  - Continue routine PP care - Lactation consult prn.  - Discussed contraceptive options including implant, IUDs hormonal and non-hormonal, injection, pills/ring/patch, condoms, and NFP. Pt desires BTL- will have pt make appt with Dr Dalbert Garnet for 2 wk f/u to discuss options.  - Immunization status: all Imms up to date   Disposition: Does desire Dc home today.    Randa Ngo, CNM 12/02/2020  10:26 AM

## 2020-12-03 NOTE — Progress Notes (Signed)
RN reviewed discharge instructions with patient and patient verbalized understanding. Bleeding, infection, breastfeeding and bottle feeding discussed with patient, follow up care and all medications reviewed. All questions answered and patient discharged via wheelchair by RN with baby.

## 2020-12-03 NOTE — Progress Notes (Signed)
Depo and Varicella administered per pharmacy verbal order. Pharmacy stated to hold off on Tdap and Flu vaccine until COVID resolved. Pt educated and understood. CNM aware.

## 2020-12-05 LAB — SURGICAL PATHOLOGY

## 2021-11-24 NOTE — L&D Delivery Note (Signed)
Delivery Note   At  0656, viable female was delivered vaginally over an intact perineum. Second stage marked by repetitive early decels. Delivery achieved over several contractions with excellent pushing efforts by the mother.  (Presentation:  ROA-ROT    ). Nuchal cord x 1 reduced on the perineum. The anterior shoulder was delivered with gentle downward traction; the posterior, with upward guidance. Thick meconium fluid noted with the delivery, little to no amniotic fluid seen. (Unknown duration of SROM.)The infant was placed on the maternal abdomen, and the cord quickly clamped and cut due to the heavy presence of meconium.  APGAR:8 9, ; weight  8 lbs.  3 vessel  with the following complications:  thick meconium, no prenatal care, unknow time of ROM.Marland Kitchen    Anesthesia: epidural  Episiotomy:  none Lacerations:  none Suture Repair:  NA Est. Blood Loss (mL):  50 mls  Mom to postpartum.  Baby to Nursery.  Mirna Mires 06/22/2022, 7:20 AM

## 2022-06-22 ENCOUNTER — Inpatient Hospital Stay
Admission: EM | Admit: 2022-06-22 | Discharge: 2022-06-23 | DRG: 807 | Disposition: A | Discharge status: Home / Self Care | Payer: Medicaid Other | Attending: Obstetrics and Gynecology | Admitting: Obstetrics and Gynecology

## 2022-06-22 ENCOUNTER — Inpatient Hospital Stay: Payer: Medicaid Other | Admitting: Anesthesiology

## 2022-06-22 ENCOUNTER — Other Ambulatory Visit: Payer: Self-pay

## 2022-06-22 ENCOUNTER — Encounter: Payer: Self-pay | Admitting: Obstetrics and Gynecology

## 2022-06-22 DIAGNOSIS — O34211 Maternal care for low transverse scar from previous cesarean delivery: Secondary | ICD-10-CM | POA: Diagnosis present

## 2022-06-22 DIAGNOSIS — Z3A41 41 weeks gestation of pregnancy: Secondary | ICD-10-CM | POA: Diagnosis not present

## 2022-06-22 DIAGNOSIS — O480 Post-term pregnancy: Secondary | ICD-10-CM | POA: Diagnosis present

## 2022-06-22 DIAGNOSIS — O0933 Supervision of pregnancy with insufficient antenatal care, third trimester: Secondary | ICD-10-CM

## 2022-06-22 DIAGNOSIS — Z23 Encounter for immunization: Secondary | ICD-10-CM | POA: Diagnosis not present

## 2022-06-22 DIAGNOSIS — Z30017 Encounter for initial prescription of implantable subdermal contraceptive: Secondary | ICD-10-CM | POA: Diagnosis not present

## 2022-06-22 DIAGNOSIS — O34219 Maternal care for unspecified type scar from previous cesarean delivery: Secondary | ICD-10-CM

## 2022-06-22 DIAGNOSIS — O479 False labor, unspecified: Principal | ICD-10-CM | POA: Diagnosis present

## 2022-06-22 DIAGNOSIS — O1494 Unspecified pre-eclampsia, complicating childbirth: Secondary | ICD-10-CM

## 2022-06-22 DIAGNOSIS — O0930 Supervision of pregnancy with insufficient antenatal care, unspecified trimester: Secondary | ICD-10-CM

## 2022-06-22 LAB — HEPATITIS B SURFACE ANTIGEN: Hepatitis B Surface Ag: NONREACTIVE

## 2022-06-22 LAB — COMPREHENSIVE METABOLIC PANEL
ALT: 9 U/L (ref 0–44)
AST: 22 U/L (ref 15–41)
Albumin: 3.1 g/dL — ABNORMAL LOW (ref 3.5–5.0)
Alkaline Phosphatase: 201 U/L — ABNORMAL HIGH (ref 38–126)
Anion gap: 9 (ref 5–15)
BUN: 14 mg/dL (ref 6–20)
CO2: 20 mmol/L — ABNORMAL LOW (ref 22–32)
Calcium: 8.8 mg/dL — ABNORMAL LOW (ref 8.9–10.3)
Chloride: 106 mmol/L (ref 98–111)
Creatinine, Ser: 0.66 mg/dL (ref 0.44–1.00)
GFR, Estimated: >60 mL/min (ref >60)
Glucose, Bld: 109 mg/dL — ABNORMAL HIGH (ref 70–99)
Potassium: 3.7 mmol/L (ref 3.5–5.1)
Sodium: 135 mmol/L (ref 135–145)
Total Bilirubin: 0.7 mg/dL (ref 0.3–1.2)
Total Protein: 7.3 g/dL (ref 6.5–8.1)

## 2022-06-22 LAB — URINALYSIS, ROUTINE W REFLEX MICROSCOPIC
Bilirubin Urine: NEGATIVE
Glucose, UA: NEGATIVE mg/dL
Ketones, ur: NEGATIVE mg/dL
Leukocytes,Ua: NEGATIVE
Nitrite: NEGATIVE
Protein, ur: 100 mg/dL — AB
RBC / HPF: >50 RBC/hpf — ABNORMAL HIGH (ref 0–5)
Specific Gravity, Urine: 1.025 (ref 1.005–1.030)
pH: 6 (ref 5.0–8.0)

## 2022-06-22 LAB — URINE DRUG SCREEN, QUALITATIVE (ARMC ONLY)
Amphetamines, Ur Screen: NOT DETECTED
Barbiturates, Ur Screen: NOT DETECTED
Benzodiazepine, Ur Scrn: NOT DETECTED
Cannabinoid 50 Ng, Ur ~~LOC~~: NOT DETECTED
Cocaine Metabolite,Ur ~~LOC~~: NOT DETECTED
MDMA (Ecstasy)Ur Screen: NOT DETECTED
Methadone Scn, Ur: NOT DETECTED
Opiate, Ur Screen: NOT DETECTED
Phencyclidine (PCP) Ur S: NOT DETECTED
Tricyclic, Ur Screen: NOT DETECTED

## 2022-06-22 LAB — CBC
HCT: 35.9 % — ABNORMAL LOW (ref 36.0–46.0)
Hemoglobin: 11.1 g/dL — ABNORMAL LOW (ref 12.0–15.0)
MCH: 22.7 pg — ABNORMAL LOW (ref 26.0–34.0)
MCHC: 30.9 g/dL (ref 30.0–36.0)
MCV: 73.6 fL — ABNORMAL LOW (ref 80.0–100.0)
Platelets: 277 10*3/uL (ref 150–400)
RBC: 4.88 MIL/uL (ref 3.87–5.11)
RDW: 17.7 % — ABNORMAL HIGH (ref 11.5–15.5)
WBC: 12 10*3/uL — ABNORMAL HIGH (ref 4.0–10.5)
nRBC: 0 % (ref 0.0–0.2)

## 2022-06-22 LAB — WET PREP, GENITAL
Clue Cells Wet Prep HPF POC: NONE SEEN
Sperm: NONE SEEN
Trich, Wet Prep: NONE SEEN
WBC, Wet Prep HPF POC: <10 — AB (ref <10)
Yeast Wet Prep HPF POC: NONE SEEN

## 2022-06-22 LAB — RPR: RPR Ser Ql: NONREACTIVE

## 2022-06-22 LAB — GLUCOSE, CAPILLARY: Glucose-Capillary: 114 mg/dL — ABNORMAL HIGH (ref 70–99)

## 2022-06-22 LAB — PROTEIN / CREATININE RATIO, URINE
Creatinine, Urine: 131 mg/dL
Protein Creatinine Ratio: 0.47 mg/mg{Cre} — ABNORMAL HIGH (ref 0.00–0.15)
Total Protein, Urine: 62 mg/dL

## 2022-06-22 LAB — TYPE AND SCREEN
ABO/RH(D): O POS
Antibody Screen: NEGATIVE

## 2022-06-22 LAB — CHLAMYDIA/NGC RT PCR (ARMC ONLY)
Chlamydia Tr: NOT DETECTED
N gonorrhoeae: NOT DETECTED

## 2022-06-22 LAB — GROUP B STREP BY PCR: Group B strep by PCR: NEGATIVE

## 2022-06-22 MED ORDER — OXYTOCIN BOLUS FROM INFUSION: 333.0000 mL | Freq: Once | INTRAVENOUS | Status: AC

## 2022-06-22 MED ORDER — LACTATED RINGERS IV SOLN
500.0000 mL | INTRAVENOUS | Status: DC | PRN
  Administered 2022-06-22: 250 mL via INTRAVENOUS

## 2022-06-22 MED ORDER — FENTANYL-BUPIVACAINE-NACL 0.5-0.125-0.9 MG/250ML-% EP SOLN
12.0000 mL/h | EPIDURAL | Status: DC | PRN
  Administered 2022-06-22: 12 mL/h via EPIDURAL
  Filled 2022-06-22: qty 250

## 2022-06-22 MED ORDER — TETANUS-DIPHTH-ACELL PERTUSSIS 5-2.5-18.5 LF-MCG/0.5 IM SUSY
0.5000 mL | PREFILLED_SYRINGE | Freq: Once | INTRAMUSCULAR | Status: AC
  Administered 2022-06-23: 0.5 mL via INTRAMUSCULAR
  Filled 2022-06-22 (×2): qty 0.5

## 2022-06-22 MED ORDER — OXYTOCIN-SODIUM CHLORIDE 30-0.9 UT/500ML-% IV SOLN
INTRAVENOUS | Status: AC
  Administered 2022-06-22: 333 mL via INTRAVENOUS
  Filled 2022-06-22: qty 500

## 2022-06-22 MED ORDER — PENICILLIN G POT IN DEXTROSE 60000 UNIT/ML IV SOLN: 3.0000 10*6.[IU] | INTRAVENOUS | Status: DC

## 2022-06-22 MED ORDER — ONDANSETRON HCL 4 MG PO TABS: 4.0000 mg | ORAL_TABLET | ORAL | Status: DC | PRN

## 2022-06-22 MED ORDER — FERROUS SULFATE 325 (65 FE) MG PO TABS
325.0000 mg | ORAL_TABLET | Freq: Two times a day (BID) | ORAL | Status: DC
  Administered 2022-06-22 (×2): 325 mg via ORAL
  Filled 2022-06-22 (×2): qty 1

## 2022-06-22 MED ORDER — LACTATED RINGERS IV SOLN: INTRAVENOUS | Status: DC

## 2022-06-22 MED ORDER — EPHEDRINE 5 MG/ML INJ: 10.0000 mg | INTRAVENOUS | Status: DC | PRN

## 2022-06-22 MED ORDER — SODIUM CHLORIDE 0.9 % IV SOLN
INTRAVENOUS | Status: AC
  Administered 2022-06-22: 5 10*6.[IU] via INTRAVENOUS
  Filled 2022-06-22: qty 5

## 2022-06-22 MED ORDER — DIPHENHYDRAMINE HCL 25 MG PO CAPS: 25.0000 mg | ORAL_CAPSULE | Freq: Four times a day (QID) | ORAL | Status: DC | PRN

## 2022-06-22 MED ORDER — LIDOCAINE-EPINEPHRINE (PF) 1.5 %-1:200000 IJ SOLN
INTRAMUSCULAR | Status: DC | PRN
  Administered 2022-06-22: 3 mL via EPIDURAL

## 2022-06-22 MED ORDER — PRENATAL MULTIVITAMIN CH
1.0000 | ORAL_TABLET | Freq: Every day | ORAL | Status: DC
  Administered 2022-06-22 – 2022-06-23 (×2): 1 via ORAL
  Filled 2022-06-22 (×2): qty 1

## 2022-06-22 MED ORDER — AMMONIA AROMATIC IN INHA
RESPIRATORY_TRACT | Status: AC
  Filled 2022-06-22: qty 10

## 2022-06-22 MED ORDER — ACETAMINOPHEN 325 MG PO TABS
650.0000 mg | ORAL_TABLET | ORAL | Status: DC | PRN
  Administered 2022-06-22 (×2): 650 mg via ORAL
  Filled 2022-06-22 (×2): qty 2

## 2022-06-22 MED ORDER — ACETAMINOPHEN 325 MG PO TABS: 650.0000 mg | ORAL_TABLET | ORAL | Status: DC | PRN

## 2022-06-22 MED ORDER — WITCH HAZEL-GLYCERIN EX PADS: 1.0000 | MEDICATED_PAD | CUTANEOUS | Status: DC | PRN

## 2022-06-22 MED ORDER — COCONUT OIL OIL: 1.0000 | TOPICAL_OIL | Status: DC | PRN

## 2022-06-22 MED ORDER — PHENYLEPHRINE 80 MCG/ML (10ML) SYRINGE FOR IV PUSH (FOR BLOOD PRESSURE SUPPORT): 80.0000 ug | PREFILLED_SYRINGE | INTRAVENOUS | Status: DC | PRN

## 2022-06-22 MED ORDER — SODIUM CHLORIDE 0.9 % IV SOLN: 5.0000 10*6.[IU] | Freq: Once | INTRAVENOUS | Status: AC

## 2022-06-22 MED ORDER — OXYCODONE-ACETAMINOPHEN 5-325 MG PO TABS: 1.0000 | ORAL_TABLET | ORAL | Status: DC | PRN

## 2022-06-22 MED ORDER — ONDANSETRON HCL 4 MG/2ML IJ SOLN: 4.0000 mg | Freq: Four times a day (QID) | INTRAMUSCULAR | Status: DC | PRN

## 2022-06-22 MED ORDER — ZOLPIDEM TARTRATE 5 MG PO TABS: 5.0000 mg | ORAL_TABLET | Freq: Every evening | ORAL | Status: DC | PRN

## 2022-06-22 MED ORDER — LACTATED RINGERS IV SOLN: 500.0000 mL | Freq: Once | INTRAVENOUS | Status: DC

## 2022-06-22 MED ORDER — DIPHENHYDRAMINE HCL 50 MG/ML IJ SOLN: 12.5000 mg | INTRAMUSCULAR | Status: DC | PRN

## 2022-06-22 MED ORDER — ONDANSETRON HCL 4 MG/2ML IJ SOLN: 4.0000 mg | INTRAMUSCULAR | Status: DC | PRN

## 2022-06-22 MED ORDER — DOCUSATE SODIUM 100 MG PO CAPS
100.0000 mg | ORAL_CAPSULE | Freq: Two times a day (BID) | ORAL | Status: DC
  Administered 2022-06-23: 100 mg via ORAL
  Filled 2022-06-22: qty 1

## 2022-06-22 MED ORDER — BENZOCAINE-MENTHOL 20-0.5 % EX AERO
1.0000 | INHALATION_SPRAY | CUTANEOUS | Status: DC | PRN
  Administered 2022-06-22: 1 via TOPICAL
  Filled 2022-06-22: qty 56

## 2022-06-22 MED ORDER — LIDOCAINE HCL (PF) 1 % IJ SOLN
INTRAMUSCULAR | Status: AC
  Filled 2022-06-22: qty 30

## 2022-06-22 MED ORDER — IBUPROFEN 600 MG PO TABS
600.0000 mg | ORAL_TABLET | Freq: Four times a day (QID) | ORAL | Status: DC
  Administered 2022-06-22 – 2022-06-23 (×5): 600 mg via ORAL
  Filled 2022-06-22 (×5): qty 1

## 2022-06-22 MED ORDER — DIBUCAINE (PERIANAL) 1 % EX OINT: 1.0000 | TOPICAL_OINTMENT | CUTANEOUS | Status: DC | PRN

## 2022-06-22 MED ORDER — SODIUM CHLORIDE 0.9 % IV SOLN
INTRAVENOUS | Status: DC | PRN
  Administered 2022-06-22 (×2): 5 mL via EPIDURAL

## 2022-06-22 MED ORDER — MISOPROSTOL 200 MCG PO TABS
ORAL_TABLET | ORAL | Status: AC
  Filled 2022-06-22: qty 4

## 2022-06-22 MED ORDER — OXYTOCIN 10 UNIT/ML IJ SOLN
INTRAMUSCULAR | Status: AC
  Filled 2022-06-22: qty 2

## 2022-06-22 MED ORDER — OXYCODONE-ACETAMINOPHEN 5-325 MG PO TABS: 2.0000 | ORAL_TABLET | ORAL | Status: DC | PRN

## 2022-06-22 MED ORDER — LIDOCAINE HCL (PF) 1 % IJ SOLN
INTRAMUSCULAR | Status: DC | PRN
  Administered 2022-06-22: 3 mL via SUBCUTANEOUS

## 2022-06-22 MED ORDER — SIMETHICONE 80 MG PO CHEW: 80.0000 mg | CHEWABLE_TABLET | ORAL | Status: DC | PRN

## 2022-06-22 MED ORDER — LIDOCAINE HCL (PF) 1 % IJ SOLN: 30.0000 mL | INTRAMUSCULAR | Status: DC | PRN

## 2022-06-22 MED ORDER — OXYTOCIN-SODIUM CHLORIDE 30-0.9 UT/500ML-% IV SOLN
2.5000 [IU]/h | INTRAVENOUS | Status: DC
  Administered 2022-06-22: 2.5 [IU]/h via INTRAVENOUS

## 2022-06-22 NOTE — Progress Notes (Signed)
Maria Gates is a 36 y.o. [id number] at [redacted]w[redacted]d by LMP admitted for active labor. Her course is complicated by no prenatal care this pregnancy.She has been admitted at 7 cms, and requested an epidiural.  Subjective: Very quiet affect through contractions. No explanation given regarding lack of prenatal care- does share feeling extremely depressed, FOB is not involved, patient has moved in with her mother. Still desires epidural anesthesia.  Objective: BP (!) 126/92 (BP Location: Left Arm)   Pulse 100   Temp 98 F (36.7 C) (Oral)   Resp 18   Ht 5\' 1"  (1.549 m)   Wt 71.2 kg   LMP 09/04/2021 (Exact Date)   SpO2 97%   BMI 29.66 kg/m  No intake/output data recorded. Total I/O In: 67.7 [I.V.:67.7] Out: -   FHT:  FHR: 125  bpm, variability: {fhr variability:21617},  accelerations:  {Accelerations:21618},  decelerations:  {fhr decel present:21619} UC:   {obgyn contractions reg/irreg:312982} SVE:   Dilation: 7 Effacement (%): 80 Station: 0 Exam by:: [email or web address] CNM  Labs: Lab Results  Component Value Date   WBC 12.0 (H) 06/22/2022   HGB 11.1 (L) 06/22/2022   HCT 35.9 (L) 06/22/2022   MCV 73.6 (L) 06/22/2022   PLT 277 06/22/2022    Assessment / Plan: {CHL LABOR ASSESSMENT:21627}  Labor: {CHL LABOR PROGRESS:21622} Preeclampsia:  {CHL PREECLAMPSIA PLAN:21623} Fetal Wellbeing:  {CHL FWB PLAN:21624} Pain Control:  {CHL FWB PLAN:21625} I/D:  {NA AND 06/24/2022 Anticipated MOD:  RFFMBWGY:65993}  {TTS:17793}, CNM 06/22/2022, 6:07 AM

## 2022-06-22 NOTE — Progress Notes (Signed)
Consult requesting PCP needs due to patients history of lack of prenatal care. Patient has been encouraged previously to establish herself with prenatal care. Patient has [facility] listed under her PCP provider. [place] provides [facility]. CSW also added the [facility] to patients AVS.

## 2022-06-22 NOTE — H&P (Signed)
Maria Gates is a 36 y.o. female presented to Labor and Delivery as an unassigned patient.She is, per record review, a Gravida 9 Para 7 who is 41 weeks 4 days gestation per LMP dating. She has had no prenatal care; who began contracting several hours ago. She has a history of one CS, with two successful VBACs since. Upon her arrival at 76, she was found to be 7 cms dilated, denying evidence of  SROM.  Her GBS status is unknown, although she has a hx of negative testing in 2022 with last birth. OB History     Gravida  9   Para  7   Term  7   Preterm      AB  1   Living  7      SAB  1   IAB      Ectopic      Multiple  0   Live Births  7          Past Medical History:  Diagnosis Date   Depression    Medical history non-contributory    Microcytic anemia 01/11/2019   Past Surgical History:  Procedure Laterality Date   CESAREAN SECTION N/A 01/02/2017   Procedure: CESAREAN SECTION;  Surgeon: Linzie Collin, MD;  Location: [facility] ORS;  Service: Obstetrics;  Laterality: N/A;   Family History: family history includes Hypertension in her maternal grandmother. Social History:  reports that she has never smoked. She has never used smokeless tobacco. She reports that she does not drink alcohol and does not use drugs.  OB History     Gravida  9   Para  7   Term  7   Preterm      AB  1   Living  7      SAB  1   IAB      Ectopic      Multiple  0   Live Births  7          2010-SVD term 2012 SVD term 2013 SAB at 7 weeks 2014 SVD 2016SVD 2018 Primary C Section- 9 lb baby 2020 VBAC 2022 VBAC- insufficient prenatal care     Maternal Diabetes: unknown this pregnancy. She does has have a reported hx ofGDM Genetic Screening: Declined Maternal Ultrasounds/Referrals: Other: Fetal Ultrasounds or other Referrals:  Other:  No care Maternal Substance Abuse:  No Significant Maternal Medications:  None Significant Maternal Lab Results:  Other: O+ Number of  Prenatal Visits:Less than or equal to 3 verified prenatal visits Other Comments:   Arrived to the unit in active labor and 7 cms dilated  Review of Systems  Constitutional: Negative.   HENT: Negative.    Eyes: Negative.   Respiratory: Negative.    Cardiovascular: Negative.   Gastrointestinal:        Gravid abdomen  Endocrine: Negative.   Genitourinary: Negative.   Allergic/Immunologic: Negative.   Neurological: Negative.   Hematological: Negative.   Psychiatric/Behavioral: Negative.     Maternal Medical History:  Reason for admission: Contractions.   Contractions: Onset was 3-5 hours ago.   Frequency: regular.   Duration is approximately 2 minutes.   Perceived severity is moderate.   Fetal activity: Perceived fetal activity is normal.   Last perceived fetal movement was within the past 12 hours.     Dilation: 7 Effacement (%): 90 Station: 0 Exam by:: Liana Crocker CNM Blood pressure (!) 126/92, pulse 100, temperature 98 F (36.7 C), temperature source Oral, resp. rate 18,  height 5\' 1"  (1.549 m), weight 71.2 kg, last menstrual period 09/04/2021, unknown if currently breastfeeding. Maternal Exam:  Uterine Assessment: Contraction strength is moderate.  Contraction duration is 80 seconds. Contraction frequency is regular.  Abdomen: Patient reports no abdominal tenderness. Surgical scars: low transverse.   Fetal presentation: vertex Introitus: Normal vulva. Vagina is positive for vaginal discharge.  Pelvis: adequate for delivery.   Cervix: Cervix evaluated by digital exam.     Physical Exam Constitutional:      Appearance: Normal appearance. She is obese.  HENT:     Head: Normocephalic and atraumatic.  Eyes:     Extraocular Movements: Extraocular movements intact.     Pupils: Pupils are equal, round, and reactive to light.  Cardiovascular:     Rate and Rhythm: Normal rate and regular rhythm.     Pulses: Normal pulses.     Heart sounds: Normal heart sounds.  Pulmonary:      Effort: Pulmonary effort is normal.     Breath sounds: Normal breath sounds.  Abdominal:     Comments: Gravid abdomen  Genitourinary:    General: Normal vulva.     Vagina: Vaginal discharge present.     Comments: Moderate vaginal discharge. SVE: 7 cms/90%/0 station Musculoskeletal:        General: Normal range of motion.     Cervical back: Normal range of motion and neck supple.  Skin:    General: Skin is warm and dry.  Neurological:     General: No focal deficit present.     Mental Status: She is oriented to person, place, and time.  Psychiatric:     Comments: Slightly flat affect     Prenatal labs: ABO, Rh: --/--/O POS (07/30 0451) Antibody: NEG (07/30 0451) Rubella:   RPR:    HBsAg:    HIV:    GBS:     Assessment/Plan: IUP of 41 weeks 4 days gestation pre LMP,  No prenatal care this pregnancy G9 P 7017 with History of primary CS in 2018 with two VBACs since. Active labor Requesting epidural.  Plan: Admission orders placed, including prenatal labs: HIV, RPR, Type and Screen, GBS culture, CBC, UDS, GC, CZ, EFM continuous IV fluids- LR Will treat for unknown GBS status Dr. 2019 notified of patient's desire for TOLAC Will order Transition of Care services. Expectant management- Anticipate VBAC.    [name] 06/22/2022, 5:46 AM

## 2022-06-22 NOTE — Progress Notes (Signed)
Epidural catheter removed without difficulty. Tip intact. Site around insertion cleaned with 2x2 and Band-Aid applied. Pt ambulated to bathroom without difficulty. RN noted when patient sat down on the toilet, the Band-Aid was saturated with blood and running down her back. Pressure applied with 2x2 for 1 minute. Bleeding stopped. Bruise noted. Band-aid and another 2x2 reapplied. Pt transferred to [facility] with no further bleeding noted. Dr. Suzan Slick, Anesthesia, made aware. Elaina Hoops

## 2022-06-22 NOTE — Anesthesia Preprocedure Evaluation (Signed)
Anesthesia Evaluation  Patient identified by MRN, date of birth, ID band Patient awake    Reviewed: Allergy & Precautions, H&P , NPO status , Patient's Chart, lab work & pertinent test results  History of Anesthesia Complications Negative for: history of anesthetic complications  Airway Mallampati: III  TM Distance: <3 FB Neck ROM: full    Dental no notable dental hx. (+) Chipped   Pulmonary neg pulmonary ROS, neg shortness of breath,    Pulmonary exam normal        Cardiovascular Exercise Tolerance: Good (-) hypertensionnegative cardio ROS Normal cardiovascular exam     Neuro/Psych PSYCHIATRIC DISORDERS    GI/Hepatic GERD  ,  Endo/Other    Renal/GU   negative genitourinary   Musculoskeletal   Abdominal   Peds  Hematology  (+) Blood dyscrasia, anemia ,   Anesthesia Other Findings TOLAC COVID 19 +  Cat II prior to epidural placement   Past Medical History: No date: Depression No date: Medical history non-contributory 01/11/2019: Microcytic anemia  Past Surgical History: 01/02/2017: CESAREAN SECTION; N/A     Comment:  Procedure: CESAREAN SECTION;  Surgeon: Linzie Collin, MD;  Location: [facility] ORS;  Service: Obstetrics;                Laterality: N/A;  BMI    Body Mass Index: 32.03 kg/m      Reproductive/Obstetrics (+) Pregnancy                             Anesthesia Physical  Anesthesia Plan  ASA: 2  Anesthesia Plan: Epidural   Post-op Pain Management:    Induction:   PONV Risk Score and Plan:   Airway Management Planned: Natural Airway  Additional Equipment:   Intra-op Plan:   Post-operative Plan:   Informed Consent: I have reviewed the patients History and Physical, chart, labs and discussed the procedure including the risks, benefits and alternatives for the proposed anesthesia with the patient or authorized representative who has indicated  his/her understanding and acceptance.     Dental Advisory Given  Plan Discussed with: Anesthesiologist  Anesthesia Plan Comments: (Patient reports no bleeding problems and no anticoagulant use.   Patient consented for risks of anesthesia including but not limited to:  - adverse reactions to medications - risk of bleeding, infection and or nerve damage from epidural that could lead to paralysis - risk of headache or failed epidural - nerve damage due to positioning - that if epidural is used for C-section that there is a chance of epidural failure requiring spinal placement or conversion to GA - Damage to heart, brain, lungs, other parts of body or loss of life  Patient voiced understanding.)        Anesthesia Quick Evaluation

## 2022-06-22 NOTE — Discharge Instructions (Signed)
Discharge instructions:   Call office if you have any of the following:  headache, visual changes, fever >101.0 F, chills, breast concerns (engorgement, mastitis) excessive vaginal bleeding, incision drainage or problems, leg pain or redness, depression or any other concerns.   Activity: Do not lift > 10 lbs for 6 weeks.  No intercourse or tampons for 6 weeks.  No driving for 1-2 weeks or while taking pain medication. No strenuous activity or heavy lifting for 6 weeks.  No swimming pools, hot tubs or tub baths- showers only.    It is normal to bleed for up to 6 weeks. You should not soak through more than 1 pad in 1 hour.   Continue prenatal vitamin. Increase calories and fluids while breastfeeding.  Your milk will come in, in the next couple of days (right now it is colostrum).  You may have a slight fever when your milk comes in, but it should go away on its own.   If it does not, and rises above 101 F please call the doctor.  You will also feel achy and your breasts will be firm. They will also start to leak.  If you are breastfeeding, continue as you have been and you can pump/express milk for comfort.   For concerns about your baby, please call your pediatrician For breastfeeding concerns, the lactation consultant can be reached at [phone]  Postpartum blues (feelings of happy one minute and sad another minute) are normal for the first few weeks but if it gets worse let your doctor know.   

## 2022-06-22 NOTE — Anesthesia Procedure Notes (Signed)
Epidural Patient location during procedure: OB Start time: 06/22/2022 6:00 AM End time: 06/22/2022 6:04 AM  Staffing Anesthesiologist: Lenard Simmer, MD Performed: anesthesiologist   Preanesthetic Checklist Completed: patient identified, IV checked, site marked, risks and benefits discussed, surgical consent, monitors and equipment checked, pre-op evaluation and timeout performed  Epidural Patient position: sitting Prep: ChloraPrep Patient monitoring: heart rate, continuous pulse ox and blood pressure Approach: midline Location: L3-L4 Injection technique: LOR saline  Needle:  Needle type: Tuohy  Needle gauge: 17 G Needle length: 9 cm Needle insertion depth: 4.5 cm Catheter type: closed end flexible Catheter size: 19 Gauge Catheter at skin depth: 9.5 cm Test dose: negative and 1.5% lidocaine with Epi 1:200 K  Assessment Sensory level: T10 Events: blood not aspirated, injection not painful, no injection resistance, no paresthesia and negative IV test  Additional Notes 1st attempt Pt. Evaluated and documentation done after procedure finished. Patient identified. Risks/Benefits/Options discussed with patient including but not limited to bleeding, infection, nerve damage, paralysis, failed block, incomplete pain control, headache, blood pressure changes, nausea, vomiting, reactions to medication both or allergic, itching and postpartum back pain. Confirmed with bedside nurse the patient's most recent platelet count. Confirmed with patient that they are not currently taking any anticoagulation, have any bleeding history or any family history of bleeding disorders. Patient expressed understanding and wished to proceed. All questions were answered. Sterile technique was used throughout the entire procedure. Please see nursing notes for vital signs. Test dose was given through epidural catheter and negative prior to continuing to dose epidural or start infusion. Warning signs of high block  given to the patient including shortness of breath, tingling/numbness in hands, complete motor block, or any concerning symptoms with instructions to call for help. Patient was given instructions on fall risk and not to get out of bed. All questions and concerns addressed with instructions to call with any issues or inadequate analgesia.    Patient tolerated the insertion well without immediate complications.Reason for block:procedure for pain

## 2022-06-22 NOTE — Discharge Summary (Signed)
Postpartum Discharge Summary  Date of Service updated 06/22/2022     Patient Name: Maria Gates DOB: 1985/12/16 MRN: [id number]  Date of admission: 06/22/2022 Delivery date:06/22/2022  Delivering provider: Imagene Riches  Date of discharge: 06/23/2022  Admitting diagnosis: Uterine contractions at greater than 20 weeks of gestation [O47.9] Intrauterine pregnancy: [redacted]w[redacted]d    Secondary diagnosis:  Principal Problem:   Uterine contractions at greater than 20 weeks of gestation Active Problems:   Insufficient prenatal care, unspecified trimester   Postpartum care following vaginal delivery  Additional problems: unknown duration of ruptured membranes    Discharge diagnosis: Term Pregnancy Delivered                                              Post partum procedures: Nexplanon placement Augmentation: N/A Complications: None  Hospital course: Onset of Labor With Vaginal Delivery      36y.o. yo [id number] 451w4das admitted in Active Labor on 06/22/2022. Patient had an uncomplicated labor course as follows:  Membrane Rupture Time/Date:  ,   Delivery Method:VBAC, Spontaneous  Episiotomy: None  Lacerations:  None  Patient had an uncomplicated postpartum course.  She is ambulating, tolerating a regular diet, passing flatus, and urinating well. Patient is discharged home in stable condition on 06/23/22.  Newborn Data: Birth date:06/22/2022  Birth time:6:56 AM  Gender:Female  Living status:Living  Apgars:8 ,9  Weight:3620 g   Magnesium Sulfate received: No BMZ received: No Rhophylac:N/A MMR:No T-DaP: given postaprtally Flu: No Transfusion:No  Physical exam  Vitals:   06/22/22 1552 06/22/22 2012 06/23/22 0114 06/23/22 0703  BP: 123/86 119/82 119/75 108/63  Pulse: 63 79 60 62  Resp: 16 18 18 18   Temp: 98.6 F (37 C) 98.2 F (36.8 C) 98.1 F (36.7 C) 97.9 F (36.6 C)  TempSrc: Oral   Oral  SpO2: 99% 100% 100% 99%  Weight:      Height:       General: alert,  cooperative, and no distress Lochia: appropriate Uterine Fundus: firm Incision: Healing well with no significant drainage DVT Evaluation: No evidence of DVT seen on physical exam. Negative Homan's sign. Labs: Lab Results  Component Value Date   WBC 10.6 (H) 06/23/2022   HGB 8.3 (L) 06/23/2022   HCT 27.3 (L) 06/23/2022   MCV 75.0 (L) 06/23/2022   PLT 249 06/23/2022      Latest Ref Rng & Units 06/22/2022    4:51 AM  CMP  Glucose 70 - 99 mg/dL 109   BUN 6 - 20 mg/dL 14   Creatinine 0.44 - 1.00 mg/dL 0.66   Sodium 135 - 145 mmol/L 135   Potassium 3.5 - 5.1 mmol/L 3.7   Chloride 98 - 111 mmol/L 106   CO2 22 - 32 mmol/L 20   Calcium 8.9 - 10.3 mg/dL 8.8   Total Protein 6.5 - 8.1 g/dL 7.3   Total Bilirubin 0.3 - 1.2 mg/dL 0.7   Alkaline Phos 38 - 126 U/L 201   AST 15 - 41 U/L 22   ALT 0 - 44 U/L 9    Edinburgh Score:    06/22/2022    4:13 PM  Edinburgh Postnatal Depression Scale Screening Tool  I have been able to laugh and see the funny side of things. 0  I have looked forward with enjoyment to things. 1  I  have blamed myself unnecessarily when things went wrong. 2  I have been anxious or worried for no good reason. 2  I have felt scared or panicky for no good reason. 1  Things have been getting on top of me. 1  I have been so unhappy that I have had difficulty sleeping. 1  I have felt sad or miserable. 1  I have been so unhappy that I have been crying. 1  The thought of harming myself has occurred to me. 0  Edinburgh Postnatal Depression Scale Total 10      After visit meds:  Allergies as of 06/23/2022   No Known Allergies      Medication List     STOP taking these medications    acetaminophen 325 MG tablet Commonly known as: Tylenol   ascorbic acid 500 MG tablet Commonly known as: VITAMIN C   benzocaine-Menthol 20-0.5 % Aero Commonly known as: DERMOPLAST   coconut oil Oil   ibuprofen 600 MG tablet Commonly known as: ADVIL   senna-docusate 8.6-50  MG tablet Commonly known as: Senokot-S   simethicone 80 MG chewable tablet Commonly known as: MYLICON   witch hazel-glycerin pad Commonly known as: TUCKS       TAKE these medications    ferrous sulfate 325 (65 FE) MG tablet Take 1 tablet (325 mg total) by mouth 2 (two) times daily with a meal.   prenatal multivitamin Tabs tablet Take 1 tablet by mouth daily at 12 noon.         Discharge home in stable condition Infant Feeding: Bottle Infant Disposition:home with mother Discharge instruction: per After Visit Summary and Postpartum booklet. Activity: Advance as tolerated. Pelvic rest for 6 weeks.  Diet: routine diet Anticipated Birth Control: PP Nexplanon placed See Nexplanon insertion procedural note. postparttum Appointment:6 weeks Additional Postpartum F/U: Postpartum Depression checkup Future Appointments:No future appointments. Follow up Visit:  Follow-up Information     [facility]. Schedule an appointment as soon as possible for a visit in 2 week(s).   Contact information: [address]                    06/23/2022 Imagene Riches, CNM

## 2022-06-23 DIAGNOSIS — O1494 Unspecified pre-eclampsia, complicating childbirth: Secondary | ICD-10-CM

## 2022-06-23 DIAGNOSIS — Z30017 Encounter for initial prescription of implantable subdermal contraceptive: Secondary | ICD-10-CM

## 2022-06-23 LAB — CBC
HCT: 27.3 % — ABNORMAL LOW (ref 36.0–46.0)
Hemoglobin: 8.3 g/dL — ABNORMAL LOW (ref 12.0–15.0)
MCH: 22.8 pg — ABNORMAL LOW (ref 26.0–34.0)
MCHC: 30.4 g/dL (ref 30.0–36.0)
MCV: 75 fL — ABNORMAL LOW (ref 80.0–100.0)
Platelets: 249 10*3/uL (ref 150–400)
RBC: 3.64 MIL/uL — ABNORMAL LOW (ref 3.87–5.11)
RDW: 17.6 % — ABNORMAL HIGH (ref 11.5–15.5)
WBC: 10.6 10*3/uL — ABNORMAL HIGH (ref 4.0–10.5)
nRBC: 0 % (ref 0.0–0.2)

## 2022-06-23 LAB — RAPID HIV SCREEN (HIV 1/2 AB+AG)
HIV 1/2 Antibodies: NONREACTIVE
HIV-1 P24 Antigen - HIV24: NONREACTIVE

## 2022-06-23 LAB — RUBELLA SCREEN: Rubella: 1.5 index (ref >0.99)

## 2022-06-23 MED ORDER — LIDOCAINE HCL 1 % IJ SOLN: 0.0000 mL | Freq: Once | INTRAMUSCULAR | Status: DC | PRN

## 2022-06-23 MED ORDER — ETONOGESTREL 68 MG ~~LOC~~ IMPL
68.0000 mg | DRUG_IMPLANT | Freq: Once | SUBCUTANEOUS | Status: AC
  Administered 2022-06-23: 68 mg via SUBCUTANEOUS
  Filled 2022-06-23: qty 1

## 2022-06-23 MED ORDER — LIDOCAINE HCL (PF) 1 % IJ SOLN
INTRAMUSCULAR | Status: DC
Start: 2022-06-23 — End: 2022-06-23
  Filled 2022-06-23: qty 30

## 2022-06-23 NOTE — Clinical Social Work Maternal (Signed)
CLINICAL SOCIAL WORK MATERNAL/CHILD NOTE  Patient Details  Name: Maria Gates MRN: [id number] Date of Birth: 06-17-1986  Date:  06/23/2022  Clinical Social Worker Initiating Note:  Doran Clay RN BSN Case Manager Date/Time: Initiated:  06/23/22/1123     Child's Name:  Maria Gates   Biological Parents:  Mother, Father   Need for Interpreter:  None   Reason for Referral:  Late or No Prenatal Care     Address:  [place] [address]    Phone number:  [phone] (home)     Additional phone number: NA  Household Members/Support Persons (Maria):   Household Member/Support Person 1, Household Member/Support Person 2, Household Member/Support Person 3, Household Member/Support Person 4, Household Member/Support Person 5, Household Member/Support Person 80, Household Member/Support Person 7, Household Member/Support Person 8   Maria Name Relationship DOB or Age  Maria -1 Maria Gates Mother 61  Maria -2 Maria Gates daughter 65  Maria Gates daughter 13  Maria Gates daughter 73  Maria Gates daughter 7  Maria -6 Maria Gates son 5  Maria Gates daughter 3  Maria Gates daughter 18 months    Natural Supports (not living in the home):      Professional Supports:     Employment: Unemployed   Type of Work:     Education:  Programmer, systems   Homebound arranged:    Museum/gallery curator Resources:      Other Resources:  Physicist, medical  , [facility]   Cultural/Religious Considerations Which May Impact Care:  NA  Strengths:  Ability to meet basic needs  , Home prepared for child  , Pediatrician chosen   Psychotropic Medications:         Pediatrician:    [facility]  Pediatrician List:   [facility] ([place])  [facility]      Pediatrician Fax Number:    Risk  Factors/Current Problems:  Compliance with Treatment     Cognitive State:  Alert  , Able to Concentrate     Mood/Affect:  Relaxed  , Calm     CSW Assessment: TOC consult received for no prenatal care and post partum depression screening score 10.  RNCM met with patient at the bedside, introduced self and explained reason for visit.  When asked why she did not receive any prenatal care patient reports that cost was a factor and depression was a factor.  This will be her eighth child, she says that people were constantly looking at her and saying your pregnant again, but not in a congratulatory way, she became depressed and did not want to go out.  Her partner is also in [place] and she reports that he did not want any other children.  When asked if he will be a part of the baby's life she reports "I hope so".   Father's name is Domenica Fail 54 years old.  Pediatrician will be with [facility] and she has an appointment on Wed at 1120, all of her children go here, they have [facility] Medicaid.  They get food stamps and 2 of her children and this one will be on [facility].  Home is prepared, she has a car seat, baby will sleep in the bassinet.   No concerns noted from TOC.  Patient given resources for  post partum depression including post partum depression progress sheet, encouraged to talk to her provider if experiencing ongoing symptoms.       CSW Plan/Description:  No Further Intervention Required/No Barriers to Discharge    Shelbie Hutching, RN 06/23/2022, 11:28 AM

## 2022-06-23 NOTE — Anesthesia Postprocedure Evaluation (Signed)
Anesthesia Post Note  Patient: Maria Gates  Procedure(s) Performed: AN AD HOC LABOR EPIDURAL  Patient location during evaluation: Mother Baby Anesthesia Type: Epidural Level of consciousness: awake and alert Pain management: pain level controlled Vital Signs Assessment: post-procedure vital signs reviewed and stable Respiratory status: spontaneous breathing, nonlabored ventilation and respiratory function stable Cardiovascular status: stable Postop Assessment: no headache, no backache and epidural receding Anesthetic complications: no   No notable events documented.   Last Vitals:  Vitals:   06/23/22 0114 06/23/22 0703  BP: 119/75 108/63  Pulse: 60 62  Resp: 18 18  Temp: 36.7 C 36.6 C  SpO2: 100% 99%    Last Pain:  Vitals:   06/23/22 0800  TempSrc:   PainSc: 0-No pain                 Zacari Radick Joanette Gula

## 2022-06-23 NOTE — Progress Notes (Signed)
Patient discharged home with family.  Discharge instructions, when to follow up, and medications reviewed with patient.  Patient verbalized understanding. Patient will be escorted out by auxiliary.   

## 2022-06-23 NOTE — Progress Notes (Signed)
Patient ID: Maria Gates, female   DOB: Sep 25, 1986, 36 y.o.   MRN: [id number] Maria Gates desires Nexplanon placement prior to hospital discharge.   Patient opts for Nexplanon for contraception.  She understands that Nexplanon is a progesterone only therapy, and that patients often patients have irregular and unpredictable vaginal bleeding or amenorrhea. She understands that other side effects are possible related to systemic progesterone, including but not limited to, headaches, breast tenderness, nausea, and irritability. While effective at preventing pregnancy long acting reversible contraceptives do not prevent transmission of sexually transmitted diseases and use of barrier methods for this purpose was discussed.  The placement procedure for Nexplanon was reviewed with the patient in detail including risks of nerve injury, infection, bleeding and injury to other muscles or tendons. She understands that the Nexplanon implant is good for 3 years and needs to be removed at the end of that time.  She understands that Nexplanon is an extremely effective option for contraception, with failure rate of <1%.   GYNECOLOGY PROCEDURE NOTE  Patient is a 36 y.o. [id number] presenting for Nexplanon insertion as her desires means of contraception.  She provided informed consent, signed copy in the chart, time out was performed. She is presently one day post delivery (S/P NSVD at 41 weeks 4 days).She understands that Nexplanon is a progesterone only therapy, and that patients often patients have irregular and unpredictable vaginal bleeding or amenorrhea. She understands that other side effects are possible related to systemic progesterone, including but not limited to, headaches, breast tenderness, nausea, and irritability. While effective at preventing pregnancy long acting reversible contraceptives do not prevent transmission of sexually transmitted diseases and use of barrier methods for this purpose was discussed. The  placement procedure for Nexplanon was reviewed with the patient in detail including risks of nerve injury, infection, bleeding and injury to other muscles or tendons. She understands that the Nexplanon implant is good for 3 years and needs to be removed at the end of that time.  She understands that Nexplanon is an extremely effective option for contraception, with failure rate of <1%. This information is reviewed today and all questions were answered. Informed consent was obtained, both verbally and written.   The patient is healthy and has no contraindications to Implanon use. Urine pregnancy test was performed today and was negative.  Procedure Appropriate time out taken.  Patient placed in dorsal supine with her left arm above head, elbow flexed at 90 degrees, arm resting on examination table.  The bicipital grove was palpated and site 8-10cm proximal to the medial epicondyle was indentified . The insertion site was prepped with a two betadine swabs and then injected with 2.5 cc of 1% lidocaine without epinephrine.  Nexplanon removed form sterile blister packaging,  Device confirmed in needle, before inserting full length of needle, tenting up the skin as the needle was advance.  The drug eluting rod was then deployed by pulling back the slider per the manufactures recommendation.  The implant was palpable by the clinician as well as the patient.  The insertion site covered dressed with a band aid before applying  a kerlex bandage pressure dressing..Minimal blood loss was noted during the procedure.  The patientt tolerated the procedure well.    Nexplanon SN: [id number] LOT 1 [id number] EXP: 02-2024  She was instructed to wear the bandage for 24 hours, call with any signs of infection.  She was given the Implanon card and instructed to have the rod removed in 3 years.  Charge [phone] for nexplanon device, CPT [id number] for procedure J2001 for lidocaine administration Modifer 25, plus Modifer  79 is done during a global billing visit

## 2022-06-24 LAB — SURGICAL PATHOLOGY
